# Patient Record
Sex: Female | Born: 1935
Health system: Southern US, Community
[De-identification: ages and names within clinical notes are randomized; demographics above are authoritative.]

## PROBLEM LIST (undated history)

## (undated) DIAGNOSIS — I6529 Occlusion and stenosis of unspecified carotid artery: Secondary | ICD-10-CM

## (undated) DIAGNOSIS — D492 Neoplasm of unspecified behavior of bone, soft tissue, and skin: Secondary | ICD-10-CM

## (undated) DIAGNOSIS — I10 Essential (primary) hypertension: Secondary | ICD-10-CM

## (undated) DIAGNOSIS — E7800 Pure hypercholesterolemia, unspecified: Secondary | ICD-10-CM

## (undated) DIAGNOSIS — H3530 Unspecified macular degeneration: Secondary | ICD-10-CM

## (undated) HISTORY — PX: INNER EAR SURGERY: SHX679

## (undated) HISTORY — PX: ABDOMINAL HYSTERECTOMY: SHX81

## (undated) HISTORY — PX: CHOLECYSTECTOMY: SHX55

## (undated) HISTORY — PX: COLON SURGERY: SHX602

---

## 2006-06-06 ENCOUNTER — Ambulatory Visit: Payer: Self-pay

## 2006-06-20 ENCOUNTER — Ambulatory Visit: Payer: Self-pay

## 2007-05-11 ENCOUNTER — Ambulatory Visit: Payer: Self-pay | Admitting: Family Medicine

## 2007-06-06 ENCOUNTER — Ambulatory Visit: Payer: Self-pay

## 2008-07-30 ENCOUNTER — Ambulatory Visit: Payer: Self-pay | Admitting: Gastroenterology

## 2008-08-27 ENCOUNTER — Emergency Department: Payer: Self-pay | Admitting: Internal Medicine

## 2008-08-28 ENCOUNTER — Ambulatory Visit: Payer: Self-pay | Admitting: Gastroenterology

## 2008-09-01 ENCOUNTER — Ambulatory Visit: Payer: Self-pay | Admitting: Gastroenterology

## 2008-09-15 ENCOUNTER — Ambulatory Visit: Payer: Self-pay | Admitting: Gastroenterology

## 2008-10-20 ENCOUNTER — Ambulatory Visit: Payer: Self-pay | Admitting: Gastroenterology

## 2010-03-11 ENCOUNTER — Ambulatory Visit: Payer: Self-pay | Admitting: Family Medicine

## 2010-04-12 ENCOUNTER — Ambulatory Visit: Payer: Self-pay | Admitting: Surgery

## 2010-11-05 ENCOUNTER — Ambulatory Visit: Payer: Self-pay | Admitting: Gastroenterology

## 2010-11-09 LAB — PATHOLOGY REPORT

## 2010-12-03 ENCOUNTER — Ambulatory Visit: Payer: Self-pay | Admitting: Surgery

## 2010-12-13 ENCOUNTER — Inpatient Hospital Stay: Payer: Self-pay | Admitting: Surgery

## 2010-12-15 LAB — PATHOLOGY REPORT

## 2012-04-19 ENCOUNTER — Ambulatory Visit: Payer: Self-pay | Admitting: Gastroenterology

## 2012-04-23 ENCOUNTER — Ambulatory Visit: Payer: Self-pay | Admitting: Gastroenterology

## 2012-05-08 ENCOUNTER — Ambulatory Visit: Payer: Self-pay | Admitting: Gynecologic Oncology

## 2012-05-09 LAB — CA 125: CA 125: 16 U/mL (ref 0.0–34.0)

## 2012-06-02 ENCOUNTER — Ambulatory Visit: Payer: Self-pay | Admitting: Gynecologic Oncology

## 2012-08-15 ENCOUNTER — Ambulatory Visit: Payer: Self-pay | Admitting: Gynecologic Oncology

## 2012-08-20 ENCOUNTER — Ambulatory Visit: Payer: Self-pay | Admitting: Gynecologic Oncology

## 2012-09-02 ENCOUNTER — Ambulatory Visit: Payer: Self-pay | Admitting: Gynecologic Oncology

## 2012-09-26 ENCOUNTER — Ambulatory Visit: Payer: Self-pay | Admitting: Gynecologic Oncology

## 2012-09-26 LAB — CBC
HGB: 13.4 g/dL (ref 12.0–16.0)
MCH: 31.2 pg (ref 26.0–34.0)
RBC: 4.3 10*6/uL (ref 3.80–5.20)
RDW: 13.6 % (ref 11.5–14.5)
WBC: 7.4 10*3/uL (ref 3.6–11.0)

## 2012-09-26 LAB — BASIC METABOLIC PANEL
BUN: 21 mg/dL — ABNORMAL HIGH (ref 7–18)
Chloride: 111 mmol/L — ABNORMAL HIGH (ref 98–107)
Creatinine: 1.31 mg/dL — ABNORMAL HIGH (ref 0.60–1.30)
EGFR (Non-African Amer.): 39 — ABNORMAL LOW
Potassium: 3.4 mmol/L — ABNORMAL LOW (ref 3.5–5.1)
Sodium: 144 mmol/L (ref 136–145)

## 2012-10-02 ENCOUNTER — Ambulatory Visit: Payer: Self-pay | Admitting: Gynecologic Oncology

## 2012-10-02 ENCOUNTER — Ambulatory Visit: Payer: Self-pay | Admitting: Obstetrics and Gynecology

## 2012-10-04 LAB — PATHOLOGY REPORT

## 2012-11-02 ENCOUNTER — Ambulatory Visit: Payer: Self-pay | Admitting: Gynecologic Oncology

## 2013-10-22 DIAGNOSIS — Z86010 Personal history of colonic polyps: Secondary | ICD-10-CM | POA: Insufficient documentation

## 2013-11-05 ENCOUNTER — Ambulatory Visit: Payer: Self-pay | Admitting: Gastroenterology

## 2013-11-07 LAB — PATHOLOGY REPORT

## 2014-04-07 DIAGNOSIS — H6501 Acute serous otitis media, right ear: Secondary | ICD-10-CM | POA: Diagnosis not present

## 2014-04-23 DIAGNOSIS — H3532 Exudative age-related macular degeneration: Secondary | ICD-10-CM | POA: Diagnosis not present

## 2014-04-28 DIAGNOSIS — H6500 Acute serous otitis media, unspecified ear: Secondary | ICD-10-CM | POA: Diagnosis not present

## 2014-06-04 DIAGNOSIS — H3532 Exudative age-related macular degeneration: Secondary | ICD-10-CM | POA: Diagnosis not present

## 2014-07-21 DIAGNOSIS — H3532 Exudative age-related macular degeneration: Secondary | ICD-10-CM | POA: Diagnosis not present

## 2014-07-25 NOTE — Op Note (Signed)
PATIENT NAME:  Sharon Barajas, Sharon Barajas MR#:  [id number] DATE OF BIRTH:  1931/04/30  DATE OF PROCEDURE:  10/02/2012  SURGEON:  Weber Cooks, MD  PREOPERATIVE DIAGNOSIS: Pelvic mass.    POSTOPERATIVE DIAGNOSES:  1.  Left hydrosalpinx.  2.  Adhesions, left pelvic sidewall.   PROCEDURE PERFORMED: Laparoscopy with lysis of adhesion and bilateral salpingo-oophorectomy.   COMPLICATIONS: None.   ESTIMATED BLOOD LOSS: 25 mL   INDICATION FOR SURGERY: Ms. Schlechter is a 81 year old patient who presented with slowly increasing mainly cystic mass on the left pelvic sidewall and minimal elevation of tumor markers. Therefore, the decision was made to proceed with surgery.   FINDINGS AT THE TIME OF SURGERY:  1.  Uterus and left adnexa within normal limits.  2.  Hydrosalpinx and very small and ovary on the left pelvic sidewall, encased by adhesions to the sigmoid colon.  3.  Adhesions in the previous laparotomy incision in the upper abdomen.   OPERATIVE REPORT: After adequate anesthesia had been obtained, the patient was prepped and draped in ski position. Cervix was visualized and a Hulka manipulator was inserted. Then Foley catheter was inserted.   Attention was then directed towards the abdomen. A 1 cm incision was made in the left upper abdominal quadrant. The fascia was transected and the peritoneum was identified and entered. The blunt trocar was inserted. Pneumoperitoneum was obtained and inspection was done with the above-mentioned findings. Then under direct vision, 2 5-mm trocars were placed in the left mid and lower abdomen.  Pelvic cytology was obtained. The left pelvic sidewall was entered. The cystic mass was freed by blunt and sharp dissection from the pelvic sidewall, keeping vessels and ureter under constant visualization. The mass was then freed from the sigmoid colon. The infundibulopelvic ligament was cauterized and transected. The mass was then mobilized all the way towards the uterus,  where the utero-ovarian ligament and tube were cauterized and transected. The remainder of the mass could then be freed.  It was then placed in the cul-de-sac.   Attention was directed towards the right pelvic sidewall, which was entered. Ureter and vessels were identified. The infundibulopelvic ligament was cauterized and transected. The adnexa were then mobilized towards the uterus where the utero-ovarian ligament and fallopian tube were cauterized and transected.   With visualization through a 5 mm port, the adnexa were placed in Endo Catch bag and removed through the camera port. Irrigation of the pelvis was done and adequate hemostasis was confirmed. The trocars were then removed under direct vision. There was no evidence of bleeding.   The camera port site and was closed using 2-0 Vicryl for the fascia. The subcutaneous tissues of all ports were reapproximated with 3-0 Vicryl. Dermabond was applied.   The patient tolerated the procedure well and was taken to the recovery room in satisfactory condition. Postoperative urine was clean. Pad, sponge, needle, and instrument counts were correct x 2.    ____________________________ Weber Cooks, MD bem:cc D: 10/02/2012 15:38:03 ET T: 10/02/2012 16:56:46 ET JOB#: [id number]  cc: Weber Cooks, MD, <Dictator> Weber Cooks MD ELECTRONICALLY SIGNED 10/09/2012 9:16

## 2014-09-08 DIAGNOSIS — H3532 Exudative age-related macular degeneration: Secondary | ICD-10-CM | POA: Diagnosis not present

## 2014-09-08 DIAGNOSIS — H3531 Nonexudative age-related macular degeneration: Secondary | ICD-10-CM | POA: Diagnosis not present

## 2014-09-12 DIAGNOSIS — Z8639 Personal history of other endocrine, nutritional and metabolic disease: Secondary | ICD-10-CM | POA: Diagnosis not present

## 2014-09-12 DIAGNOSIS — K219 Gastro-esophageal reflux disease without esophagitis: Secondary | ICD-10-CM | POA: Diagnosis not present

## 2014-09-12 DIAGNOSIS — R7309 Other abnormal glucose: Secondary | ICD-10-CM | POA: Diagnosis not present

## 2014-09-12 DIAGNOSIS — E782 Mixed hyperlipidemia: Secondary | ICD-10-CM | POA: Diagnosis not present

## 2014-09-12 DIAGNOSIS — I10 Essential (primary) hypertension: Secondary | ICD-10-CM | POA: Diagnosis not present

## 2014-09-16 DIAGNOSIS — H2513 Age-related nuclear cataract, bilateral: Secondary | ICD-10-CM | POA: Diagnosis not present

## 2014-09-19 DIAGNOSIS — I10 Essential (primary) hypertension: Secondary | ICD-10-CM | POA: Diagnosis not present

## 2014-09-19 DIAGNOSIS — Z8639 Personal history of other endocrine, nutritional and metabolic disease: Secondary | ICD-10-CM | POA: Diagnosis not present

## 2014-09-19 DIAGNOSIS — R0689 Other abnormalities of breathing: Secondary | ICD-10-CM | POA: Diagnosis not present

## 2014-09-19 DIAGNOSIS — E782 Mixed hyperlipidemia: Secondary | ICD-10-CM | POA: Diagnosis not present

## 2014-09-19 DIAGNOSIS — R079 Chest pain, unspecified: Secondary | ICD-10-CM | POA: Diagnosis not present

## 2014-09-19 DIAGNOSIS — R0789 Other chest pain: Secondary | ICD-10-CM | POA: Diagnosis not present

## 2014-10-17 DIAGNOSIS — R0789 Other chest pain: Secondary | ICD-10-CM | POA: Diagnosis not present

## 2014-10-17 DIAGNOSIS — E782 Mixed hyperlipidemia: Secondary | ICD-10-CM | POA: Diagnosis not present

## 2014-10-17 DIAGNOSIS — I209 Angina pectoris, unspecified: Secondary | ICD-10-CM | POA: Diagnosis not present

## 2014-10-17 DIAGNOSIS — I10 Essential (primary) hypertension: Secondary | ICD-10-CM | POA: Diagnosis not present

## 2014-10-17 DIAGNOSIS — I6523 Occlusion and stenosis of bilateral carotid arteries: Secondary | ICD-10-CM | POA: Insufficient documentation

## 2014-10-17 DIAGNOSIS — I517 Cardiomegaly: Secondary | ICD-10-CM | POA: Diagnosis not present

## 2014-10-29 DIAGNOSIS — H3532 Exudative age-related macular degeneration: Secondary | ICD-10-CM | POA: Diagnosis not present

## 2014-10-31 DIAGNOSIS — I209 Angina pectoris, unspecified: Secondary | ICD-10-CM | POA: Diagnosis not present

## 2014-10-31 DIAGNOSIS — I6523 Occlusion and stenosis of bilateral carotid arteries: Secondary | ICD-10-CM | POA: Diagnosis not present

## 2014-10-31 DIAGNOSIS — I517 Cardiomegaly: Secondary | ICD-10-CM | POA: Diagnosis not present

## 2014-11-05 DIAGNOSIS — E782 Mixed hyperlipidemia: Secondary | ICD-10-CM | POA: Diagnosis not present

## 2014-11-05 DIAGNOSIS — I6523 Occlusion and stenosis of bilateral carotid arteries: Secondary | ICD-10-CM | POA: Diagnosis not present

## 2014-11-05 DIAGNOSIS — I10 Essential (primary) hypertension: Secondary | ICD-10-CM | POA: Diagnosis not present

## 2014-11-05 DIAGNOSIS — R0789 Other chest pain: Secondary | ICD-10-CM | POA: Diagnosis not present

## 2014-11-11 DIAGNOSIS — I6529 Occlusion and stenosis of unspecified carotid artery: Secondary | ICD-10-CM | POA: Diagnosis not present

## 2014-11-11 DIAGNOSIS — E785 Hyperlipidemia, unspecified: Secondary | ICD-10-CM | POA: Diagnosis not present

## 2014-11-11 DIAGNOSIS — I10 Essential (primary) hypertension: Secondary | ICD-10-CM | POA: Diagnosis not present

## 2014-11-12 ENCOUNTER — Other Ambulatory Visit: Payer: Self-pay | Admitting: Vascular Surgery

## 2014-11-12 DIAGNOSIS — I6523 Occlusion and stenosis of bilateral carotid arteries: Secondary | ICD-10-CM

## 2014-11-19 ENCOUNTER — Ambulatory Visit
Admission: RE | Admit: 2014-11-19 | Discharge: 2014-11-19 | Disposition: A | Discharge status: Home / Self Care | Payer: Commercial Managed Care - HMO | Source: Ambulatory Visit | Attending: Vascular Surgery | Admitting: Vascular Surgery

## 2014-11-19 DIAGNOSIS — I6523 Occlusion and stenosis of bilateral carotid arteries: Secondary | ICD-10-CM

## 2014-11-19 DIAGNOSIS — I6522 Occlusion and stenosis of left carotid artery: Secondary | ICD-10-CM | POA: Insufficient documentation

## 2014-11-19 DIAGNOSIS — I6503 Occlusion and stenosis of bilateral vertebral arteries: Secondary | ICD-10-CM | POA: Diagnosis not present

## 2014-11-19 DIAGNOSIS — I6529 Occlusion and stenosis of unspecified carotid artery: Secondary | ICD-10-CM | POA: Diagnosis present

## 2014-11-19 HISTORY — DX: Essential (primary) hypertension: I10

## 2014-11-19 MED ORDER — IOHEXOL 350 MG/ML SOLN
100.0000 mL | Freq: Once | INTRAVENOUS | Status: AC | PRN
  Administered 2014-11-19: 65 mL via INTRAVENOUS

## 2014-11-21 DIAGNOSIS — E785 Hyperlipidemia, unspecified: Secondary | ICD-10-CM | POA: Diagnosis not present

## 2014-11-21 DIAGNOSIS — I10 Essential (primary) hypertension: Secondary | ICD-10-CM | POA: Diagnosis not present

## 2014-11-21 DIAGNOSIS — I6529 Occlusion and stenosis of unspecified carotid artery: Secondary | ICD-10-CM | POA: Diagnosis not present

## 2014-11-21 DIAGNOSIS — I129 Hypertensive chronic kidney disease with stage 1 through stage 4 chronic kidney disease, or unspecified chronic kidney disease: Secondary | ICD-10-CM | POA: Diagnosis not present

## 2014-12-03 DIAGNOSIS — I10 Essential (primary) hypertension: Secondary | ICD-10-CM | POA: Diagnosis not present

## 2014-12-03 DIAGNOSIS — I6523 Occlusion and stenosis of bilateral carotid arteries: Secondary | ICD-10-CM | POA: Diagnosis not present

## 2014-12-03 DIAGNOSIS — E782 Mixed hyperlipidemia: Secondary | ICD-10-CM | POA: Diagnosis not present

## 2014-12-15 ENCOUNTER — Encounter
Admission: RE | Admit: 2014-12-15 | Discharge: 2014-12-15 | Disposition: A | Discharge status: Home / Self Care | Payer: Commercial Managed Care - HMO | Source: Ambulatory Visit | Attending: Vascular Surgery | Admitting: Vascular Surgery

## 2014-12-15 DIAGNOSIS — Z90710 Acquired absence of both cervix and uterus: Secondary | ICD-10-CM | POA: Diagnosis not present

## 2014-12-15 DIAGNOSIS — I6522 Occlusion and stenosis of left carotid artery: Secondary | ICD-10-CM | POA: Diagnosis not present

## 2014-12-15 DIAGNOSIS — I2510 Atherosclerotic heart disease of native coronary artery without angina pectoris: Secondary | ICD-10-CM | POA: Diagnosis not present

## 2014-12-15 DIAGNOSIS — I252 Old myocardial infarction: Secondary | ICD-10-CM | POA: Diagnosis not present

## 2014-12-15 DIAGNOSIS — I6523 Occlusion and stenosis of bilateral carotid arteries: Secondary | ICD-10-CM | POA: Diagnosis present

## 2014-12-15 DIAGNOSIS — Z8249 Family history of ischemic heart disease and other diseases of the circulatory system: Secondary | ICD-10-CM | POA: Diagnosis not present

## 2014-12-15 DIAGNOSIS — I6529 Occlusion and stenosis of unspecified carotid artery: Secondary | ICD-10-CM | POA: Diagnosis not present

## 2014-12-15 DIAGNOSIS — E780 Pure hypercholesterolemia: Secondary | ICD-10-CM | POA: Diagnosis present

## 2014-12-15 DIAGNOSIS — H918X2 Other specified hearing loss, left ear: Secondary | ICD-10-CM | POA: Diagnosis present

## 2014-12-15 DIAGNOSIS — I10 Essential (primary) hypertension: Secondary | ICD-10-CM | POA: Diagnosis present

## 2014-12-15 HISTORY — DX: Neoplasm of unspecified behavior of bone, soft tissue, and skin: D49.2

## 2014-12-15 HISTORY — DX: Pure hypercholesterolemia, unspecified: E78.00

## 2014-12-15 HISTORY — DX: Occlusion and stenosis of unspecified carotid artery: I65.29

## 2014-12-15 LAB — CBC
HEMATOCRIT: 40.3 % (ref 35.0–47.0)
HEMOGLOBIN: 13.3 g/dL (ref 12.0–16.0)
MCH: 31.3 pg (ref 26.0–34.0)
MCHC: 33.1 g/dL (ref 32.0–36.0)
MCV: 94.5 fL (ref 80.0–100.0)
PLATELETS: 192 10*3/uL (ref 150–440)
RBC: 4.26 MIL/uL (ref 3.80–5.20)
RDW: 13.5 % (ref 11.5–14.5)
WBC: 5.8 10*3/uL (ref 3.6–11.0)

## 2014-12-15 LAB — BASIC METABOLIC PANEL
ANION GAP: 9 (ref 5–15)
BUN: 17 mg/dL (ref 6–20)
CHLORIDE: 110 mmol/L (ref 101–111)
CO2: 23 mmol/L (ref 22–32)
Calcium: 9 mg/dL (ref 8.9–10.3)
Creatinine, Ser: 0.96 mg/dL (ref 0.44–1.00)
GFR calc Af Amer: >60 mL/min (ref >60)
GFR calc non Af Amer: 54 mL/min — ABNORMAL LOW (ref >60)
GLUCOSE: 123 mg/dL — AB (ref 65–99)
POTASSIUM: 3.8 mmol/L (ref 3.5–5.1)
Sodium: 142 mmol/L (ref 135–145)

## 2014-12-15 LAB — ABO/RH: ABO/RH(D): O NEG

## 2014-12-15 LAB — SURGICAL PCR SCREEN
MRSA, PCR: NEGATIVE
STAPHYLOCOCCUS AUREUS: NEGATIVE

## 2014-12-15 LAB — TYPE AND SCREEN
ABO/RH(D): O NEG
Antibody Screen: NEGATIVE

## 2014-12-15 NOTE — Patient Instructions (Signed)
  Your procedure is scheduled on: Wednesday Sept. 14, 2016. Report to Same Day Surgery. To find out your arrival time please call [phone] between 1PM - 3PM on Tuesday Sept 13, 2016.  Remember: Instructions that are not followed completely may result in serious medical risk, up to and including death, or upon the discretion of your surgeon and anesthesiologist your surgery may need to be rescheduled.    _x___ 1. Do not eat food or drink liquids after midnight. No gum chewing or hard candies.     ____ 2. No Alcohol for 24 hours before or after surgery.   ____ 3. Bring all medications with you on the day of surgery if instructed.    _x___ 4. Notify your doctor if there is any change in your medical condition     (cold, fever, infections).     Do not wear jewelry, make-up, hairpins, clips or nail polish.  Do not wear lotions, powders, or perfumes. You may wear deodorant.  Do not shave 48 hours prior to surgery. Men may shave face and neck.  Do not bring valuables to the hospital.    [facility] is not responsible for any belongings or valuables.               Contacts, dentures or bridgework may not be worn into surgery.  Leave your suitcase in the car. After surgery it may be brought to your room.  For patients admitted to the hospital, discharge time is determined by your treatment team.   Patients discharged the day of surgery will not be allowed to drive home.    Please read over the following fact sheets that you were given:   [facility] Preparing for Surgery  _x___ Take these medicines the morning of surgery with A SIP OF WATER:    1. amLODipine (NORVASC)   2. omeprazole (PRILOSEC)   ____ Fleet Enema (as directed)   _x___ Use CHG Soap as directed  ____ Use inhalers on the day of surgery  ____ Stop metformin 2 days prior to surgery    ____ Take 1/2 of usual insulin dose the night before surgery and none on the morning of surgery.   ____ Stop  Coumadin/Plavix/aspirin on does not apply.  _x__ Stop Anti-inflammatories or Ibuprofen now. Can take Tylenol for pain.   ____ Stop supplements until after surgery.    ____ Bring C-Pap to the hospital.

## 2014-12-17 ENCOUNTER — Inpatient Hospital Stay: Payer: Commercial Managed Care - HMO | Admitting: Certified Registered"

## 2014-12-17 ENCOUNTER — Encounter
Admission: RE | Disposition: A | Discharge status: Home / Self Care | Payer: Self-pay | Source: Ambulatory Visit | Attending: Vascular Surgery

## 2014-12-17 ENCOUNTER — Encounter: Payer: Self-pay | Admitting: Anesthesiology

## 2014-12-17 ENCOUNTER — Inpatient Hospital Stay
Admission: RE | Admit: 2014-12-17 | Discharge: 2014-12-18 | DRG: 039 | Disposition: A | Discharge status: Home / Self Care | Payer: Commercial Managed Care - HMO | Source: Ambulatory Visit | Attending: Vascular Surgery | Admitting: Vascular Surgery

## 2014-12-17 DIAGNOSIS — Z90710 Acquired absence of both cervix and uterus: Secondary | ICD-10-CM

## 2014-12-17 DIAGNOSIS — Z8249 Family history of ischemic heart disease and other diseases of the circulatory system: Secondary | ICD-10-CM

## 2014-12-17 DIAGNOSIS — E780 Pure hypercholesterolemia: Secondary | ICD-10-CM | POA: Diagnosis present

## 2014-12-17 DIAGNOSIS — I10 Essential (primary) hypertension: Secondary | ICD-10-CM | POA: Diagnosis present

## 2014-12-17 DIAGNOSIS — I252 Old myocardial infarction: Secondary | ICD-10-CM | POA: Diagnosis not present

## 2014-12-17 DIAGNOSIS — H918X2 Other specified hearing loss, left ear: Secondary | ICD-10-CM | POA: Diagnosis present

## 2014-12-17 DIAGNOSIS — I6523 Occlusion and stenosis of bilateral carotid arteries: Secondary | ICD-10-CM | POA: Diagnosis present

## 2014-12-17 DIAGNOSIS — I6522 Occlusion and stenosis of left carotid artery: Secondary | ICD-10-CM | POA: Diagnosis not present

## 2014-12-17 DIAGNOSIS — I6529 Occlusion and stenosis of unspecified carotid artery: Secondary | ICD-10-CM | POA: Diagnosis present

## 2014-12-17 DIAGNOSIS — I2510 Atherosclerotic heart disease of native coronary artery without angina pectoris: Secondary | ICD-10-CM | POA: Diagnosis not present

## 2014-12-17 HISTORY — PX: ENDARTERECTOMY: SHX5162

## 2014-12-17 LAB — GLUCOSE, CAPILLARY: GLUCOSE-CAPILLARY: 134 mg/dL — AB (ref 65–99)

## 2014-12-17 SURGERY — ENDARTERECTOMY, CAROTID
Anesthesia: General | Laterality: Left

## 2014-12-17 MED ORDER — ONDANSETRON HCL 4 MG/2ML IJ SOLN: 4.0000 mg | Freq: Four times a day (QID) | INTRAMUSCULAR | Status: DC | PRN

## 2014-12-17 MED ORDER — ROCURONIUM BROMIDE 100 MG/10ML IV SOLN
INTRAVENOUS | Status: DC | PRN
  Administered 2014-12-17: 30 mg via INTRAVENOUS
  Administered 2014-12-17: 10 mg via INTRAVENOUS

## 2014-12-17 MED ORDER — DOCUSATE SODIUM 100 MG PO CAPS
100.0000 mg | ORAL_CAPSULE | Freq: Every day | ORAL | Status: DC
  Administered 2014-12-18: 100 mg via ORAL
  Filled 2014-12-17: qty 1

## 2014-12-17 MED ORDER — POTASSIUM CHLORIDE CRYS ER 20 MEQ PO TBCR: 20.0000 meq | EXTENDED_RELEASE_TABLET | Freq: Every day | ORAL | Status: DC | PRN

## 2014-12-17 MED ORDER — ASPIRIN EC 81 MG PO TBEC: 81.0000 mg | DELAYED_RELEASE_TABLET | Freq: Every day | ORAL | Status: DC

## 2014-12-17 MED ORDER — DOPAMINE-DEXTROSE 3.2-5 MG/ML-% IV SOLN: 3.0000 ug/kg/min | INTRAVENOUS | Status: DC

## 2014-12-17 MED ORDER — CLOPIDOGREL BISULFATE 75 MG PO TABS: 75.0000 mg | ORAL_TABLET | Freq: Every day | ORAL | Status: DC

## 2014-12-17 MED ORDER — SODIUM CHLORIDE 0.9 % IV SOLN
INTRAVENOUS | Status: DC
  Administered 2014-12-17 (×2): via INTRAVENOUS

## 2014-12-17 MED ORDER — CEFAZOLIN SODIUM 1-5 GM-% IV SOLN
INTRAVENOUS | Status: AC
  Filled 2014-12-17: qty 50

## 2014-12-17 MED ORDER — LIDOCAINE HCL (PF) 1 % IJ SOLN
INTRAMUSCULAR | Status: AC
Start: 2014-12-17 — End: 2014-12-17
  Filled 2014-12-17: qty 30

## 2014-12-17 MED ORDER — OCUVITE-LUTEIN PO CAPS
1.0000 | ORAL_CAPSULE | Freq: Two times a day (BID) | ORAL | Status: DC
  Administered 2014-12-17 – 2014-12-18 (×3): 1 via ORAL
  Filled 2014-12-17 (×3): qty 1

## 2014-12-17 MED ORDER — CEFAZOLIN SODIUM 1-5 GM-% IV SOLN
1.0000 g | Freq: Once | INTRAVENOUS | Status: AC
  Administered 2014-12-17: 1 g via INTRAVENOUS

## 2014-12-17 MED ORDER — ACETAMINOPHEN 325 MG PO TABS: 325.0000 mg | ORAL_TABLET | ORAL | Status: DC | PRN

## 2014-12-17 MED ORDER — PHENYLEPHRINE HCL 10 MG/ML IJ SOLN
10000.0000 ug | INTRAMUSCULAR | Status: DC | PRN
  Administered 2014-12-17: 60 ug via INTRAVENOUS

## 2014-12-17 MED ORDER — LIDOCAINE HCL 1 % IJ SOLN
INTRAMUSCULAR | Status: DC | PRN
  Administered 2014-12-17: 10 mL

## 2014-12-17 MED ORDER — OXYCODONE-ACETAMINOPHEN 5-325 MG PO TABS: 1.0000 | ORAL_TABLET | Freq: Four times a day (QID) | ORAL | Status: DC | PRN

## 2014-12-17 MED ORDER — LISINOPRIL 20 MG PO TABS
20.0000 mg | ORAL_TABLET | Freq: Every day | ORAL | Status: DC
  Administered 2014-12-17: 20 mg via ORAL
  Filled 2014-12-17: qty 1

## 2014-12-17 MED ORDER — SODIUM CHLORIDE 0.9 % IV SOLN: 500.0000 mL | Freq: Once | INTRAVENOUS | Status: DC | PRN

## 2014-12-17 MED ORDER — ESMOLOL HCL-SODIUM CHLORIDE 2000 MG/100ML IV SOLN
25.0000 ug/kg/min | INTRAVENOUS | Status: DC
  Filled 2014-12-17: qty 100

## 2014-12-17 MED ORDER — CLOPIDOGREL BISULFATE 75 MG PO TABS
75.0000 mg | ORAL_TABLET | Freq: Every day | ORAL | Status: DC
  Administered 2014-12-18: 75 mg via ORAL
  Filled 2014-12-17: qty 1

## 2014-12-17 MED ORDER — ACETAMINOPHEN 650 MG RE SUPP: 325.0000 mg | RECTAL | Status: DC | PRN

## 2014-12-17 MED ORDER — MAGNESIUM SULFATE 2 GM/50ML IV SOLN: 2.0000 g | Freq: Every day | INTRAVENOUS | Status: DC | PRN

## 2014-12-17 MED ORDER — HEPARIN SODIUM (PORCINE) 1000 UNIT/ML IJ SOLN
INTRAMUSCULAR | Status: AC
  Filled 2014-12-17: qty 1

## 2014-12-17 MED ORDER — DEXTROSE 5 % IV SOLN
1.5000 g | Freq: Two times a day (BID) | INTRAVENOUS | Status: AC
  Administered 2014-12-17 (×2): 1.5 g via INTRAVENOUS
  Filled 2014-12-17 (×2): qty 1.5

## 2014-12-17 MED ORDER — POTASSIUM CHLORIDE ER 10 MEQ PO TBCR
10.0000 meq | EXTENDED_RELEASE_TABLET | ORAL | Status: DC
  Administered 2014-12-17 – 2014-12-18 (×2): 10 meq via ORAL
  Filled 2014-12-17 (×5): qty 1

## 2014-12-17 MED ORDER — ONDANSETRON HCL 4 MG/2ML IJ SOLN: 4.0000 mg | Freq: Once | INTRAMUSCULAR | Status: DC | PRN

## 2014-12-17 MED ORDER — SODIUM CHLORIDE 0.9 % IV SOLN
INTRAVENOUS | Status: DC
  Administered 2014-12-17: 07:00:00 via INTRAVENOUS

## 2014-12-17 MED ORDER — PHENOL 1.4 % MT LIQD
1.0000 | OROMUCOSAL | Status: DC | PRN
  Filled 2014-12-17: qty 177

## 2014-12-17 MED ORDER — CEFAZOLIN SODIUM 1 G IJ SOLR
INTRAMUSCULAR | Status: AC
  Filled 2014-12-17: qty 10

## 2014-12-17 MED ORDER — PROPOFOL 10 MG/ML IV BOLUS
INTRAVENOUS | Status: DC | PRN
  Administered 2014-12-17: 100 mg via INTRAVENOUS

## 2014-12-17 MED ORDER — SUGAMMADEX SODIUM 200 MG/2ML IV SOLN
INTRAVENOUS | Status: DC | PRN
  Administered 2014-12-17: 100 mg via INTRAVENOUS

## 2014-12-17 MED ORDER — LIDOCAINE HCL (CARDIAC) 20 MG/ML IV SOLN
INTRAVENOUS | Status: DC | PRN
  Administered 2014-12-17: 60 mg via INTRAVENOUS

## 2014-12-17 MED ORDER — AMLODIPINE BESYLATE 10 MG PO TABS
10.0000 mg | ORAL_TABLET | ORAL | Status: DC
  Administered 2014-12-18: 10 mg via ORAL
  Filled 2014-12-17: qty 1

## 2014-12-17 MED ORDER — GUAIFENESIN-DM 100-10 MG/5ML PO SYRP: 15.0000 mL | ORAL_SOLUTION | ORAL | Status: DC | PRN

## 2014-12-17 MED ORDER — FENTANYL CITRATE (PF) 100 MCG/2ML IJ SOLN
INTRAMUSCULAR | Status: DC | PRN
  Administered 2014-12-17: 50 ug via INTRAVENOUS
  Administered 2014-12-17: 100 ug via INTRAVENOUS

## 2014-12-17 MED ORDER — VITAMIN D (ERGOCALCIFEROL) 1.25 MG (50000 UNIT) PO CAPS: 50000.0000 [IU] | ORAL_CAPSULE | ORAL | Status: DC

## 2014-12-17 MED ORDER — NITROGLYCERIN IN D5W 200-5 MCG/ML-% IV SOLN
INTRAVENOUS | Status: AC
  Filled 2014-12-17: qty 250

## 2014-12-17 MED ORDER — PHENYLEPHRINE HCL 10 MG/ML IJ SOLN
INTRAMUSCULAR | Status: DC | PRN
  Administered 2014-12-17 (×4): 100 ug via INTRAVENOUS

## 2014-12-17 MED ORDER — HEPARIN SODIUM (PORCINE) 1000 UNIT/ML IJ SOLN
INTRAMUSCULAR | Status: DC | PRN
  Administered 2014-12-17: 5 mL via INTRAVENOUS

## 2014-12-17 MED ORDER — LACTATED RINGERS IV SOLN: INTRAVENOUS | Status: DC

## 2014-12-17 MED ORDER — ALUM & MAG HYDROXIDE-SIMETH 200-200-20 MG/5ML PO SUSP: 15.0000 mL | ORAL | Status: DC | PRN

## 2014-12-17 MED ORDER — SODIUM CHLORIDE 0.9 % IV SOLN
INTRAVENOUS | Status: DC | PRN
  Administered 2014-12-17: 50 mL via INTRAMUSCULAR

## 2014-12-17 MED ORDER — ASPIRIN EC 81 MG PO TBEC
81.0000 mg | DELAYED_RELEASE_TABLET | ORAL | Status: DC
  Administered 2014-12-17 – 2014-12-18 (×2): 81 mg via ORAL
  Filled 2014-12-17 (×2): qty 1

## 2014-12-17 MED ORDER — OXYCODONE-ACETAMINOPHEN 5-325 MG PO TABS
1.0000 | ORAL_TABLET | ORAL | Status: DC | PRN
  Filled 2014-12-17: qty 2

## 2014-12-17 MED ORDER — SODIUM CHLORIDE 0.9 % IV SOLN
INTRAVENOUS | Status: DC
Start: 2014-12-17 — End: 2014-12-18
  Administered 2014-12-17: 12:00:00 via INTRAVENOUS

## 2014-12-17 MED ORDER — NITROGLYCERIN 0.4 MG SL SUBL: 0.4000 mg | SUBLINGUAL_TABLET | SUBLINGUAL | Status: DC | PRN

## 2014-12-17 MED ORDER — FENTANYL CITRATE (PF) 100 MCG/2ML IJ SOLN: 25.0000 ug | INTRAMUSCULAR | Status: DC | PRN

## 2014-12-17 MED ORDER — HYDRALAZINE HCL 20 MG/ML IJ SOLN: 5.0000 mg | INTRAMUSCULAR | Status: DC | PRN

## 2014-12-17 MED ORDER — LABETALOL HCL 5 MG/ML IV SOLN: 10.0000 mg | INTRAVENOUS | Status: DC | PRN

## 2014-12-17 MED ORDER — FAMOTIDINE IN NACL 20-0.9 MG/50ML-% IV SOLN
20.0000 mg | Freq: Two times a day (BID) | INTRAVENOUS | Status: DC
  Administered 2014-12-17 (×2): 20 mg via INTRAVENOUS
  Filled 2014-12-17 (×5): qty 50

## 2014-12-17 MED ORDER — METOPROLOL TARTRATE 1 MG/ML IV SOLN: 2.0000 mg | INTRAVENOUS | Status: DC | PRN

## 2014-12-17 MED ORDER — NITROGLYCERIN IN D5W 200-5 MCG/ML-% IV SOLN: 5.0000 ug/min | INTRAVENOUS | Status: DC

## 2014-12-17 MED ORDER — DEXAMETHASONE SODIUM PHOSPHATE 4 MG/ML IJ SOLN
INTRAMUSCULAR | Status: DC | PRN
  Administered 2014-12-17: 5 mg via INTRAVENOUS

## 2014-12-17 MED ORDER — ONDANSETRON HCL 4 MG/2ML IJ SOLN
INTRAMUSCULAR | Status: DC | PRN
Start: 2014-12-17 — End: 2014-12-17
  Administered 2014-12-17: 4 mg via INTRAVENOUS

## 2014-12-17 MED ORDER — SIMVASTATIN 20 MG PO TABS
20.0000 mg | ORAL_TABLET | Freq: Every day | ORAL | Status: DC
Start: 2014-12-17 — End: 2014-12-18
  Administered 2014-12-17: 20 mg via ORAL
  Filled 2014-12-17: qty 1

## 2014-12-17 MED ORDER — PANTOPRAZOLE SODIUM 40 MG PO TBEC
40.0000 mg | DELAYED_RELEASE_TABLET | Freq: Every day | ORAL | Status: DC
  Administered 2014-12-17 – 2014-12-18 (×2): 40 mg via ORAL
  Filled 2014-12-17 (×2): qty 1

## 2014-12-17 MED ORDER — MORPHINE SULFATE (PF) 2 MG/ML IV SOLN
2.0000 mg | INTRAVENOUS | Status: DC | PRN
  Administered 2014-12-17 (×2): 2 mg via INTRAVENOUS
  Filled 2014-12-17: qty 1
  Filled 2014-12-17: qty 2

## 2014-12-17 MED ORDER — SODIUM CHLORIDE 0.9 % IR SOLN
Status: DC | PRN
  Administered 2014-12-17: 150 mL

## 2014-12-17 SURGICAL SUPPLY — 58 items
BAG DECANTER FOR FLEXI CONT (MISCELLANEOUS) ×3 IMPLANT
BLADE SURG 15 STRL LF DISP TIS (BLADE) ×1 IMPLANT
BLADE SURG 15 STRL SS (BLADE) ×2
BLADE SURG SZ11 CARB STEEL (BLADE) ×3 IMPLANT
BOOT SUTURE AID YELLOW STND (SUTURE) ×3 IMPLANT
BRUSH SCRUB 4% CHG (MISCELLANEOUS) ×3 IMPLANT
CANISTER SUCT 1200ML W/VALVE (MISCELLANEOUS) ×3 IMPLANT
CATH TRAY 16F METER LATEX (MISCELLANEOUS) ×3 IMPLANT
DRAPE INCISE IOBAN 66X45 STRL (DRAPES) ×3 IMPLANT
DRAPE PED LAPAROTOMY (DRAPES) ×3 IMPLANT
DRAPE SHEET LG 3/4 BI-LAMINATE (DRAPES) ×3 IMPLANT
DRSG TEGADERM 4X4.75 (GAUZE/BANDAGES/DRESSINGS) IMPLANT
DRSG TELFA 3X8 NADH (GAUZE/BANDAGES/DRESSINGS) IMPLANT
DURAPREP 26ML APPLICATOR (WOUND CARE) ×3 IMPLANT
ELECT CAUTERY BLADE 6.4 (BLADE) ×3 IMPLANT
EVICEL 2ML SEALANT HUMAN (Miscellaneous) ×3 IMPLANT
GLOVE BIO SURGEON STRL SZ7 (GLOVE) ×6 IMPLANT
GOWN STRL REUS W/ TWL LRG LVL3 (GOWN DISPOSABLE) ×1 IMPLANT
GOWN STRL REUS W/ TWL XL LVL3 (GOWN DISPOSABLE) ×2 IMPLANT
GOWN STRL REUS W/TWL LRG LVL3 (GOWN DISPOSABLE) ×2
GOWN STRL REUS W/TWL XL LVL3 (GOWN DISPOSABLE) ×4
HEMOSTAT SURGICEL 2X3 (HEMOSTASIS) ×3 IMPLANT
IV NS 250ML (IV SOLUTION) ×2
IV NS 250ML BAXH (IV SOLUTION) ×1 IMPLANT
KIT RM TURNOVER STRD PROC AR (KITS) ×3 IMPLANT
LABEL OR SOLS (LABEL) ×3 IMPLANT
LIQUID BAND (GAUZE/BANDAGES/DRESSINGS) ×3 IMPLANT
LOOP RED MAXI  1X406MM (MISCELLANEOUS) ×4
LOOP VESSEL MAXI 1X406 RED (MISCELLANEOUS) ×2 IMPLANT
LOOP VESSEL MINI 0.8X406 BLUE (MISCELLANEOUS) ×1 IMPLANT
LOOPS BLUE MINI 0.8X406MM (MISCELLANEOUS) ×2
NEEDLE FILTER BLUNT 18X 1/2SAF (NEEDLE) ×2
NEEDLE FILTER BLUNT 18X1 1/2 (NEEDLE) ×1 IMPLANT
NEEDLE HYPO 25X1 1.5 SAFETY (NEEDLE) ×3 IMPLANT
NS IRRIG 1000ML POUR BTL (IV SOLUTION) ×3 IMPLANT
PACK BASIN MAJOR ARMC (MISCELLANEOUS) ×3 IMPLANT
PAD GROUND ADULT SPLIT (MISCELLANEOUS) ×3 IMPLANT
PATCH CAROTID ECM VASC 1X10 (Prosthesis & Implant Heart) ×3 IMPLANT
PENCIL ELECTRO HAND CTR (MISCELLANEOUS) IMPLANT
SHUNT CAROTID PRUITT F3 T3103A (SHUNT) ×3 IMPLANT
SUT MNCRL 4-0 (SUTURE) ×2
SUT MNCRL 4-0 27XMFL (SUTURE) ×1
SUT PROLENE 6 0 BV (SUTURE) ×15 IMPLANT
SUT PROLENE 7 0 BV 1 (SUTURE) ×9 IMPLANT
SUT SILK 2 0 (SUTURE) ×2
SUT SILK 2-0 18XBRD TIE 12 (SUTURE) ×1 IMPLANT
SUT SILK 3 0 (SUTURE) ×2
SUT SILK 3-0 18XBRD TIE 12 (SUTURE) ×1 IMPLANT
SUT SILK 4 0 (SUTURE) ×2
SUT SILK 4-0 18XBRD TIE 12 (SUTURE) ×1 IMPLANT
SUT VIC AB 3-0 SH 27 (SUTURE) ×4
SUT VIC AB 3-0 SH 27X BRD (SUTURE) ×2 IMPLANT
SUTURE MNCRL 4-0 27XMF (SUTURE) ×1 IMPLANT
SYR 20CC LL (SYRINGE) ×3 IMPLANT
SYRINGE 10CC LL (SYRINGE) ×6 IMPLANT
TOWEL OR 17X26 4PK STRL BLUE (TOWEL DISPOSABLE) ×3 IMPLANT
TUBING CONNECTING 10 (TUBING) IMPLANT
TUBING CONNECTING 10' (TUBING)

## 2014-12-17 NOTE — Transfer of Care (Signed)
Immediate Anesthesia Transfer of Care Note  Patient: [name]  Procedure(s) Performed: Procedure(s): ENDARTERECTOMY CAROTID (Left)  Patient Location: PACU  Anesthesia Type:General  Level of Consciousness: awake, alert , oriented and patient cooperative  Airway & Oxygen Therapy: Patient Spontanous Breathing and Patient connected to face mask oxygen  Post-op Assessment: Report given to RN, Post -op Vital signs reviewed and stable and Patient moving all extremities X 4  Post vital signs: Reviewed and stable  Last Vitals:  Filed Vitals:   12/17/14 1016  BP: 111/53  Pulse: 86  Temp: 36.8 C  Resp: 14    Complications: No apparent anesthesia complications

## 2014-12-17 NOTE — Op Note (Signed)
[facility]   OPERATIVE NOTE  PROCEDURE:   1.  Left carotid endarterectomy with CorMatrix arterial patch reconstruction  PRE-OPERATIVE DIAGNOSIS: 1.  High-grade left carotid stenosis 2. Mild to moderate right carotid artery stenosis  POST-OPERATIVE DIAGNOSIS: same as above   SURGEON: Leotis Pain, MD  ASSISTANT(S): None  ANESTHESIA: general  ESTIMATED BLOOD LOSS: 50 cc  FINDING(S): 1.  Left carotid plaque.  SPECIMEN(S):  Carotid plaque (sent to Pathology)  INDICATIONS:   Sharon Barajas is a 81 y.o. female who presents with left carotid stenosis of 80-85 %.  I discussed with the patient the risks, benefits, and alternatives to carotid endarterectomy.  I discussed the differences between carotid stenting and carotid endarterectomy. I discussed the procedural details of carotid endarterectomy with the patient.  The patient is aware that the risks of carotid endarterectomy include but are not limited to: bleeding, infection, stroke, myocardial infarction, death, cranial nerve injuries both temporary and permanent, neck hematoma, possible airway compromise, labile blood pressure post-operatively, cerebral hyperperfusion syndrome, and possible need for additional interventions in the future. The patient is aware of the risks and agrees to proceed forward with the procedure.  DESCRIPTION: After full informed written consent was obtained from the patient, the patient was brought back to the operating room and placed supine upon the operating table.  Prior to induction, the patient received IV antibiotics.  After obtaining adequate anesthesia, the patient was placed into a modified beach chair position with a shoulder roll in place and the patient's neck slightly hyperextended and rotated away from the surgical site.  The patient was prepped in the standard fashion for a carotid endarterectomy.  I made an incision anterior to the sternocleidomastoid muscle and dissected  down through the subcutaneous tissue.  The platysmas was opened with electrocautery.  Then I dissected down to the internal jugular vein and facial vein.  The facial vein is ligated and divided between 2-0 silk ties.  This was dissected posteriorly until I obtained visualization of the common carotid artery.  This was dissected out and then a vessel loop was placed around the common carotid artery.  I then dissected in a periadventitial fashion along the common carotid artery up to the bifurcation.  I then identified the external carotid artery and the superior thyroid artery.  I placed a vessel loop around the superior thyroid artery, and I also dissected out the external carotid artery and placed a vessel loop around it. The superior thyroid artery was quite small and I elected to ligate the artery using a 6-0 Prolene and a 3-0 silk on the arterial side and a 3-0 silk on the distal side. In the process of this dissection, the hypoglossal nerve was identified and protected from harm.  I then dissected out the internal carotid artery until I identified an area in the internal carotid artery clearly above the stenosis.  I dissected slightly distal to this area, and placed a vessel loop around the artery.  At this point, we gave the patient 5000 units of intravenous heparin.  After this was allowed to circulate for several minutes, I pulled up control on the vessel loops to clamp the internal carotid artery, external carotid artery, and then the common carotid artery.  I then made an arteriotomy in the common carotid artery with a 11 blade, and extended the arteriotomy with a Potts scissor down into the common carotid artery, then I carried the arteriotomy through the bifurcation into the internal carotid artery  until I reached an area that was not diseased.  At this point, I took the [place] shunt that previously been prepared and I inserted it into the internal carotid artery first, and then into the common  carotid artery taking care to flush and de-air prior to release of control. At this point, I started the endarterectomy in the common carotid artery with a [place] elevator and carried this dissection down into the common carotid artery circumferentially.  Then I transected the plaque at a segment where it was adherent and transected the plaque with Potts scissors.  I then carried this dissection up into the external carotid artery.  The plaque was extracted by unclamping the external carotid artery and performing an eversion endarterectomy.  The dissection was then carried into the internal carotid artery where a nice feathered end point was created with gentle traction.  I passed the plaque off the field as a specimen. At this point I removed all loose flecks and remaining disease possible.  At this point, I was satisfied that the minimal remaining disease was densely adherent to the wall and wall integrity was intact. The distal endpoint was tacked down with three 7-0 Prolene sutures.  I then fashioned a CorMatrix arterial patch for the artery and sewed it in place with two running stitch of 6-0 Prolene.  I started at the distal endpoint and ran one half the length of the arteriotomy.  I then cut and beveled the patch to an appropriate length to match the arteriotomy.  I started the second 6-0 Prolene at the proximal end point.  The medial suture line was completed and the lateral suture line was run approximately one quarter the length of the arteriotomy.  Prior to completing this patch angioplasty, I removed the shunt first from the internal carotid artery, from which there was excellent backbleeding, and clamped it.  Then I removed the shunt from the common carotid artery, from which there was excellent antegrade bleeding, and then clamped it.  At this point, I allowed the external carotid artery to backbleed, which was excellent.  Then I instilled heparinized saline in this patched artery and then completed  the patch angioplasty in the usual fashion.  First, I released the clamp on the external carotid artery, then I released it on the common carotid artery.  After waiting a few seconds, I then released it on the internal carotid artery. Several minutes of pressure were held and four 6-0 Prolene patch sutures were used as needed for hemostasis.  At this point, I placed Surgicel and Evicel topical hemostatic agents.  There was no more active bleeding in the surgical site.  The sternocleidomastoid space was closed with three interrupted 3-0 Vicryl sutures. I then reapproximated the platysma muscle with a running stitch of 3-0 Vicryl.  The skin was then closed with a running subcuticular 4-0 Monocryl.  The skin was then cleaned, dried and Dermabond was used to reinforce the skin closure.  The patient awakened and was taken to the recovery room in stable condition, following commands and moving all four extremities without any apparent deficits.    COMPLICATIONS: none  CONDITION: stable  Sharon Barajas  12/17/2014, 10:09 AM

## 2014-12-17 NOTE — Discharge Instructions (Signed)
You may shower as of Friday. Keep incision clean and dry, Dermabond to fall off on its own. No driving while on pain medication.

## 2014-12-17 NOTE — Anesthesia Procedure Notes (Signed)
Procedure Name: Intubation Date/Time: 12/17/2014 7:57 AM Performed by: Silvana Newness Pre-anesthesia Checklist: Patient identified, Emergency Drugs available, Suction available, Patient being monitored and Timeout performed Patient Re-evaluated:Patient Re-evaluated prior to inductionOxygen Delivery Method: Circle system utilized Preoxygenation: Pre-oxygenation with 100% oxygen Intubation Type: IV induction Ventilation: Mask ventilation without difficulty Laryngoscope Size: Mac and 3 Grade View: Grade I Tube type: Oral Tube size: 7.0 mm Number of attempts: 1 Airway Equipment and Method: Rigid stylet Placement Confirmation: ETT inserted through vocal cords under direct vision,  positive ETCO2 and breath sounds checked- equal and bilateral Secured at: 18 cm Tube secured with: Tape Dental Injury: Teeth and Oropharynx as per pre-operative assessment

## 2014-12-17 NOTE — Progress Notes (Signed)
Patient resting quietly post op. Some neck pain which is appropriate and typical No neck swelling or signs of bleeding Neuro exam appears intact with no obvious deficits HR around 80 BP around 112/48 Clear liquids today, can have regular diet tomorrow Watch in ccu overnight Check labs in the am

## 2014-12-17 NOTE — Anesthesia Preprocedure Evaluation (Signed)
Anesthesia Evaluation  Patient identified by MRN, date of birth, ID band Patient awake    Reviewed: Allergy & Precautions, H&P , NPO status , Patient's Chart, lab work & pertinent test results, reviewed documented beta blocker date and time   History of Anesthesia Complications Negative for: history of anesthetic complications  Airway Mallampati: III  TM Distance: >3 FB Neck ROM: full    Dental no notable dental hx. (+) Poor Dentition, Missing   Pulmonary neg pulmonary ROS,    Pulmonary exam normal breath sounds clear to auscultation       Cardiovascular Exercise Tolerance: Good hypertension, On Medications (-) angina+ Peripheral Vascular Disease (Carotid artery disease)  (-) CAD, (-) Past MI, (-) Cardiac Stents and (-) CABG Normal cardiovascular exam(-) dysrhythmias  Rhythm:regular Rate:Normal     Neuro/Psych negative neurological ROS  negative psych ROS   GI/Hepatic Neg liver ROS, GERD  Medicated and Controlled,  Endo/Other  negative endocrine ROS  Renal/GU negative Renal ROS  negative genitourinary   Musculoskeletal   Abdominal   Peds  Hematology negative hematology ROS (+)   Anesthesia Other Findings Past Medical History:   Hypertension                                                 Hypercholesteremia                                           Carotid artery stenosis                                        Comment:bilateral   Ear tumors                                                     Comment:tumor in left ear removed. total loss of               hearing in left ear.   Reproductive/Obstetrics negative OB ROS                             Anesthesia Physical Anesthesia Plan  ASA: III  Anesthesia Plan: General   Post-op Pain Management:    Induction:   Airway Management Planned:   Additional Equipment:   Intra-op Plan:   Post-operative Plan:   Informed Consent: I  have reviewed the patients History and Physical, chart, labs and discussed the procedure including the risks, benefits and alternatives for the proposed anesthesia with the patient or authorized representative who has indicated his/her understanding and acceptance.   Dental Advisory Given  Plan Discussed with: Anesthesiologist, CRNA and Surgeon  Anesthesia Plan Comments:         Anesthesia Quick Evaluation

## 2014-12-17 NOTE — Discharge Summary (Signed)
[facility]    Discharge Summary    Patient ID:  Sharon Barajas MRN: [id number] DOB/AGE: Jan 09, 1932 81 y.o.  Admit date: 12/17/2014 Discharge date: 12/18/2014 Date of Surgery: 12/17/2014 Surgeon: Surgeon(s): Algernon Huxley, MD  Admission Diagnosis: carotid artery stenosis  Discharge Diagnoses:  carotid artery stenosis  Secondary Diagnoses: Past Medical History  Diagnosis Date  . Hypertension   . Hypercholesteremia   . Carotid artery stenosis     bilateral  . Ear tumors     tumor in left ear removed. total loss of hearing in left ear.    Procedure(s): ENDARTERECTOMY CAROTID (LEFT)  Discharged Condition: Good  HPI:  The patient is a 81 year old female s/p left carotid endarterectomy for high grade left carotid stenosis.  Hospital Course:  [name] is a 81 y.o. female is S/P:  Procedure(s): ENDARTERECTOMY CAROTID (LEFT) Extubated: POD # 0  The patients inpatient stay was uneventful. She tolerated the procedure well and was transferred from the PACU to ICU for close observation. Her night of surgery was uneventful. POD #1 the patients foley was removed, her diet was advanced and her pain was controlled with PO medication. She was ambulating independently.   Physical exam:  A&Ox3, NAD Face: No facial droop, tongue midline Neck: Trachea midline, incision clean dry and intact with dermabond CV: RRR Pulmonary: CTA Bilaterally Abdomen: Soft, Nontender, Nondistended Extremity: Neuro, vascular, muscular intact - equal strength upper, lower, right and left  Labs as below Complications: None  Consults:   None  Significant Diagnostic Studies: CBC Lab Results  Component Value Date   WBC 7.5 12/18/2014   HGB 11.5* 12/18/2014   HCT 34.8* 12/18/2014   MCV 93.7 12/18/2014   PLT 153 12/18/2014    BMET    Component Value Date/Time   NA 143 12/18/2014 0436   NA 144 09/26/2012 1340   K 4.2 12/18/2014 0436   K 3.4* 09/26/2012  1340   CL 113* 12/18/2014 0436   CL 111* 09/26/2012 1340   CO2 22 12/18/2014 0436   CO2 26 09/26/2012 1340   GLUCOSE 104* 12/18/2014 0436   GLUCOSE 116* 09/26/2012 1340   BUN 15 12/18/2014 0436   BUN 21* 09/26/2012 1340   CREATININE 0.86 12/18/2014 0436   CREATININE 1.31* 09/26/2012 1340   CALCIUM 8.2* 12/18/2014 0436   CALCIUM 9.1 09/26/2012 1340   GFRNONAA >60 12/18/2014 0436   GFRNONAA 39* 09/26/2012 1340   GFRAA >60 12/18/2014 0436   GFRAA 45* 09/26/2012 1340   COAG No results found for: INR, PROTIME   Disposition:  Discharge to: Home    Medication List    TAKE these medications        amLODipine 10 MG tablet  Commonly known as:  NORVASC  Take 10 mg by mouth every morning.     aspirin 81 MG tablet  Take 81 mg by mouth every morning.     clopidogrel 75 MG tablet  Commonly known as:  PLAVIX  Take 1 tablet (75 mg total) by mouth daily.     ergocalciferol 50000 UNITS capsule  Commonly known as:  VITAMIN D2  Take 50,000 Units by mouth once a week.     ICAPS Caps  Take 1 capsule by mouth 2 (two) times daily.     lisinopril 20 MG tablet  Commonly known as:  PRINIVIL,ZESTRIL  Take 20 mg by mouth at bedtime.     nitroGLYCERIN 0.4 MG SL tablet  Commonly known as:  NITROSTAT  Place 0.4 mg under the tongue every 5 (five) minutes as needed for chest pain.     omeprazole 20 MG capsule  Commonly known as:  PRILOSEC  Take 20 mg by mouth 2 (two) times daily before a meal.     oxyCODONE-acetaminophen 5-325 MG per tablet  Commonly known as:  PERCOCET/ROXICET  Take 1 tablet by mouth every 6 (six) hours as needed (Pain).     potassium chloride 10 MEQ tablet  Commonly known as:  K-DUR  Take 10 mEq by mouth every morning.     simvastatin 20 MG tablet  Commonly known as:  ZOCOR  Take 20 mg by mouth daily at 6 PM.       Verbal and written Discharge instructions given to the patient. Wound care per Discharge AVS Follow-up Information    Follow up with DEW,JASON,  MD On 01/09/2015.   Specialties:  Vascular Surgery, Radiology, Interventional Cardiology   Why:  at 2:15pm   Contact information:   [place] [address] [phone]       Signed: Sela Hua, PA-C  12/18/2014, 4:43 PM

## 2014-12-17 NOTE — H&P (Signed)
  [facility] History & Physical Update  The patient was interviewed and re-examined.  The patient's previous History and Physical has been reviewed and is unchanged.  There is no change in the plan of care. We plan to proceed with the scheduled procedure.  DEW,JASON, MD  12/17/2014, 7:28 AM

## 2014-12-18 LAB — CBC
HCT: 34.8 % — ABNORMAL LOW (ref 35.0–47.0)
Hemoglobin: 11.5 g/dL — ABNORMAL LOW (ref 12.0–16.0)
MCH: 31 pg (ref 26.0–34.0)
MCHC: 33.1 g/dL (ref 32.0–36.0)
MCV: 93.7 fL (ref 80.0–100.0)
PLATELETS: 153 10*3/uL (ref 150–440)
RBC: 3.72 MIL/uL — AB (ref 3.80–5.20)
RDW: 13.5 % (ref 11.5–14.5)
WBC: 7.5 10*3/uL (ref 3.6–11.0)

## 2014-12-18 LAB — BASIC METABOLIC PANEL
ANION GAP: 8 (ref 5–15)
BUN: 15 mg/dL (ref 6–20)
CO2: 22 mmol/L (ref 22–32)
Calcium: 8.2 mg/dL — ABNORMAL LOW (ref 8.9–10.3)
Chloride: 113 mmol/L — ABNORMAL HIGH (ref 101–111)
Creatinine, Ser: 0.86 mg/dL (ref 0.44–1.00)
GFR calc Af Amer: >60 mL/min (ref >60)
GLUCOSE: 104 mg/dL — AB (ref 65–99)
POTASSIUM: 4.2 mmol/L (ref 3.5–5.1)
SODIUM: 143 mmol/L (ref 135–145)

## 2014-12-18 LAB — SURGICAL PATHOLOGY

## 2014-12-18 MED ORDER — OXYCODONE-ACETAMINOPHEN 5-325 MG PO TABS: 1.0000 | ORAL_TABLET | Freq: Four times a day (QID) | ORAL | Status: DC | PRN

## 2014-12-18 MED ORDER — CLOPIDOGREL BISULFATE 75 MG PO TABS: 75.0000 mg | ORAL_TABLET | Freq: Every day | ORAL | Status: DC

## 2014-12-18 NOTE — Progress Notes (Signed)
Patient ambulated around nursing station x3 without any difficulty.  Tolerated diet well, per Dr Lucky Cowboy who was at bedside, patient can go home without eating a regular diet.   Patient updated.  Discharge instructions and prescription given to patient and sister without further questions.  IVs removed, catheter intact. Patient belonging returned.

## 2014-12-18 NOTE — Anesthesia Postprocedure Evaluation (Signed)
  Anesthesia Post-op Note  Patient: [place]  Procedure(s) Performed: Procedure(s): ENDARTERECTOMY CAROTID (Left)  Anesthesia type:General  Patient location: CCU8  Post pain: Pain level controlled  Post assessment: Post-op Vital signs reviewed, Patient's Cardiovascular Status Stable, Respiratory Function Stable, Patent Airway and No signs of Nausea or vomiting  Post vital signs: Reviewed and stable  Last Vitals:  Filed Vitals:   12/18/14 0700  BP: 128/55  Pulse:   Temp:   Resp: 19    Level of consciousness: awake, alert  and patient cooperative  Complications: No apparent anesthesia complications

## 2014-12-18 NOTE — Progress Notes (Signed)
[facility]  Daily Progress Note  Subjective: 1 Day Post-Op: Left Carotid Endarterectomy for high grade carotid stenosis.  Patient without complaint this AM - states she is a "little sore". No issues over night. Foley removed this AM.   Objective: Filed Vitals:   12/17/14 2200 12/17/14 2358 12/18/14 0333 12/18/14 0700  BP: 138/58 131/55 130/52 128/55  Pulse: 69 79 93   Temp:  97.7 F (36.5 C) 97.7 F (36.5 C)   TempSrc:  Oral Oral   Resp: 18 20 22 19   SpO2: 98% 97% 97% 92%    Intake/Output Summary (Last 24 hours) at 12/18/14 0802 Last data filed at 12/18/14 0334  Gross per 24 hour  Intake   1100 ml  Output    860 ml  Net    240 ml    Physical Exam: A&Ox3, NAD Face: No facial droop, tongue midline Neck: Trachea midline, incision clean dry and intact with dermabond CV: RRR Pulmonary: CTA Bilaterally Abdomen: Soft, Nontender, Nondistended Extremity: Neuro, vascular, muscular intact - equal strength upper, lower, right and left   Laboratory: CBC    Component Value Date/Time   WBC 7.5 12/18/2014 0436   WBC 7.4 09/26/2012 1340   HGB 11.5* 12/18/2014 0436   HGB 13.4 09/26/2012 1340   HCT 34.8* 12/18/2014 0436   HCT 39.0 09/26/2012 1340   PLT 153 12/18/2014 0436   PLT 239 09/26/2012 1340    BMET    Component Value Date/Time   NA 143 12/18/2014 0436   NA 144 09/26/2012 1340   K 4.2 12/18/2014 0436   K 3.4* 09/26/2012 1340   CL 113* 12/18/2014 0436   CL 111* 09/26/2012 1340   CO2 22 12/18/2014 0436   CO2 26 09/26/2012 1340   GLUCOSE 104* 12/18/2014 0436   GLUCOSE 116* 09/26/2012 1340   BUN 15 12/18/2014 0436   BUN 21* 09/26/2012 1340   CREATININE 0.86 12/18/2014 0436   CREATININE 1.31* 09/26/2012 1340   CALCIUM 8.2* 12/18/2014 0436   CALCIUM 9.1 09/26/2012 1340   GFRNONAA >60 12/18/2014 0436   GFRNONAA 39* 09/26/2012 1340   GFRAA >60 12/18/2014 0436   GFRAA 45* 09/26/2012 1340    Assessment/Planning: 81 year old female Left  Carotid Endarterectomy for high grade carotid stenosis doing well 1) Patient may be discharged home if she passes her trial of void, is tolerating a regular diet, her pain is controlled by PO medications and she is ambulating well   [employer] PA-C 12/18/2014 8:02 AM

## 2015-02-02 DIAGNOSIS — E782 Mixed hyperlipidemia: Secondary | ICD-10-CM | POA: Diagnosis not present

## 2015-02-02 DIAGNOSIS — I6523 Occlusion and stenosis of bilateral carotid arteries: Secondary | ICD-10-CM | POA: Diagnosis not present

## 2015-02-02 DIAGNOSIS — I10 Essential (primary) hypertension: Secondary | ICD-10-CM | POA: Diagnosis not present

## 2015-03-13 DIAGNOSIS — E782 Mixed hyperlipidemia: Secondary | ICD-10-CM | POA: Diagnosis not present

## 2015-03-20 DIAGNOSIS — E782 Mixed hyperlipidemia: Secondary | ICD-10-CM | POA: Diagnosis not present

## 2015-03-20 DIAGNOSIS — M8580 Other specified disorders of bone density and structure, unspecified site: Secondary | ICD-10-CM | POA: Diagnosis not present

## 2015-03-20 DIAGNOSIS — I10 Essential (primary) hypertension: Secondary | ICD-10-CM | POA: Diagnosis not present

## 2015-03-20 DIAGNOSIS — J069 Acute upper respiratory infection, unspecified: Secondary | ICD-10-CM | POA: Diagnosis not present

## 2015-03-20 DIAGNOSIS — K219 Gastro-esophageal reflux disease without esophagitis: Secondary | ICD-10-CM | POA: Diagnosis not present

## 2015-03-27 DIAGNOSIS — R05 Cough: Secondary | ICD-10-CM | POA: Diagnosis not present

## 2015-03-27 DIAGNOSIS — J189 Pneumonia, unspecified organism: Secondary | ICD-10-CM | POA: Diagnosis not present

## 2015-03-31 DIAGNOSIS — J159 Unspecified bacterial pneumonia: Secondary | ICD-10-CM | POA: Diagnosis not present

## 2015-04-08 DIAGNOSIS — J00 Acute nasopharyngitis [common cold]: Secondary | ICD-10-CM | POA: Diagnosis not present

## 2015-06-02 DIAGNOSIS — I10 Essential (primary) hypertension: Secondary | ICD-10-CM | POA: Diagnosis not present

## 2015-06-02 DIAGNOSIS — I517 Cardiomegaly: Secondary | ICD-10-CM | POA: Diagnosis not present

## 2015-06-02 DIAGNOSIS — I6523 Occlusion and stenosis of bilateral carotid arteries: Secondary | ICD-10-CM | POA: Diagnosis not present

## 2015-06-02 DIAGNOSIS — E782 Mixed hyperlipidemia: Secondary | ICD-10-CM | POA: Diagnosis not present

## 2015-06-02 DIAGNOSIS — R0789 Other chest pain: Secondary | ICD-10-CM | POA: Diagnosis not present

## 2015-06-02 DIAGNOSIS — K219 Gastro-esophageal reflux disease without esophagitis: Secondary | ICD-10-CM | POA: Diagnosis not present

## 2015-07-15 DIAGNOSIS — E782 Mixed hyperlipidemia: Secondary | ICD-10-CM | POA: Diagnosis not present

## 2015-07-15 DIAGNOSIS — I10 Essential (primary) hypertension: Secondary | ICD-10-CM | POA: Diagnosis not present

## 2015-07-15 DIAGNOSIS — Z0000 Encounter for general adult medical examination without abnormal findings: Secondary | ICD-10-CM | POA: Diagnosis not present

## 2015-07-15 DIAGNOSIS — M8580 Other specified disorders of bone density and structure, unspecified site: Secondary | ICD-10-CM | POA: Diagnosis not present

## 2015-07-22 DIAGNOSIS — Z8639 Personal history of other endocrine, nutritional and metabolic disease: Secondary | ICD-10-CM | POA: Diagnosis not present

## 2015-07-22 DIAGNOSIS — I10 Essential (primary) hypertension: Secondary | ICD-10-CM | POA: Diagnosis not present

## 2015-07-22 DIAGNOSIS — K219 Gastro-esophageal reflux disease without esophagitis: Secondary | ICD-10-CM | POA: Diagnosis not present

## 2015-07-22 DIAGNOSIS — E782 Mixed hyperlipidemia: Secondary | ICD-10-CM | POA: Diagnosis not present

## 2015-09-28 DIAGNOSIS — I6523 Occlusion and stenosis of bilateral carotid arteries: Secondary | ICD-10-CM | POA: Diagnosis not present

## 2015-09-28 DIAGNOSIS — R05 Cough: Secondary | ICD-10-CM | POA: Diagnosis not present

## 2015-09-28 DIAGNOSIS — R0789 Other chest pain: Secondary | ICD-10-CM | POA: Diagnosis not present

## 2015-09-28 DIAGNOSIS — I10 Essential (primary) hypertension: Secondary | ICD-10-CM | POA: Diagnosis not present

## 2015-09-28 DIAGNOSIS — K219 Gastro-esophageal reflux disease without esophagitis: Secondary | ICD-10-CM | POA: Diagnosis not present

## 2015-09-28 DIAGNOSIS — E782 Mixed hyperlipidemia: Secondary | ICD-10-CM | POA: Diagnosis not present

## 2015-10-29 DIAGNOSIS — I10 Essential (primary) hypertension: Secondary | ICD-10-CM | POA: Diagnosis not present

## 2015-10-29 DIAGNOSIS — I517 Cardiomegaly: Secondary | ICD-10-CM | POA: Diagnosis not present

## 2015-10-29 DIAGNOSIS — I6523 Occlusion and stenosis of bilateral carotid arteries: Secondary | ICD-10-CM | POA: Diagnosis not present

## 2015-10-29 DIAGNOSIS — R0789 Other chest pain: Secondary | ICD-10-CM | POA: Diagnosis not present

## 2015-10-29 DIAGNOSIS — K219 Gastro-esophageal reflux disease without esophagitis: Secondary | ICD-10-CM | POA: Diagnosis not present

## 2015-10-29 DIAGNOSIS — E782 Mixed hyperlipidemia: Secondary | ICD-10-CM | POA: Diagnosis not present

## 2015-11-16 DIAGNOSIS — Z8639 Personal history of other endocrine, nutritional and metabolic disease: Secondary | ICD-10-CM | POA: Diagnosis not present

## 2015-11-16 DIAGNOSIS — E782 Mixed hyperlipidemia: Secondary | ICD-10-CM | POA: Diagnosis not present

## 2015-11-16 DIAGNOSIS — I10 Essential (primary) hypertension: Secondary | ICD-10-CM | POA: Diagnosis not present

## 2015-11-23 DIAGNOSIS — Z0000 Encounter for general adult medical examination without abnormal findings: Secondary | ICD-10-CM | POA: Diagnosis not present

## 2015-11-23 DIAGNOSIS — E782 Mixed hyperlipidemia: Secondary | ICD-10-CM | POA: Diagnosis not present

## 2015-11-23 DIAGNOSIS — Z8639 Personal history of other endocrine, nutritional and metabolic disease: Secondary | ICD-10-CM | POA: Diagnosis not present

## 2015-11-23 DIAGNOSIS — I10 Essential (primary) hypertension: Secondary | ICD-10-CM | POA: Diagnosis not present

## 2015-11-23 DIAGNOSIS — Z7185 Encounter for immunization safety counseling: Secondary | ICD-10-CM | POA: Insufficient documentation

## 2016-01-07 DIAGNOSIS — I340 Nonrheumatic mitral (valve) insufficiency: Secondary | ICD-10-CM | POA: Diagnosis not present

## 2016-01-07 DIAGNOSIS — K219 Gastro-esophageal reflux disease without esophagitis: Secondary | ICD-10-CM | POA: Diagnosis not present

## 2016-01-07 DIAGNOSIS — I6523 Occlusion and stenosis of bilateral carotid arteries: Secondary | ICD-10-CM | POA: Diagnosis not present

## 2016-01-07 DIAGNOSIS — I10 Essential (primary) hypertension: Secondary | ICD-10-CM | POA: Diagnosis not present

## 2016-01-15 DIAGNOSIS — K580 Irritable bowel syndrome with diarrhea: Secondary | ICD-10-CM | POA: Diagnosis not present

## 2016-01-15 DIAGNOSIS — Z86010 Personal history of colonic polyps: Secondary | ICD-10-CM | POA: Diagnosis not present

## 2016-02-23 ENCOUNTER — Ambulatory Visit
Admission: RE | Admit: 2016-02-23 | Discharge: 2016-02-23 | Disposition: A | Discharge status: Home / Self Care | Payer: Commercial Managed Care - HMO | Source: Ambulatory Visit | Attending: Gastroenterology | Admitting: Gastroenterology

## 2016-02-23 ENCOUNTER — Encounter
Admission: RE | Disposition: A | Discharge status: Home / Self Care | Payer: Self-pay | Source: Ambulatory Visit | Attending: Gastroenterology

## 2016-02-23 ENCOUNTER — Ambulatory Visit: Payer: Commercial Managed Care - HMO | Admitting: Anesthesiology

## 2016-02-23 DIAGNOSIS — Z980 Intestinal bypass and anastomosis status: Secondary | ICD-10-CM | POA: Diagnosis not present

## 2016-02-23 DIAGNOSIS — I10 Essential (primary) hypertension: Secondary | ICD-10-CM | POA: Diagnosis not present

## 2016-02-23 DIAGNOSIS — Z7982 Long term (current) use of aspirin: Secondary | ICD-10-CM | POA: Insufficient documentation

## 2016-02-23 DIAGNOSIS — K5790 Diverticulosis of intestine, part unspecified, without perforation or abscess without bleeding: Secondary | ICD-10-CM | POA: Diagnosis not present

## 2016-02-23 DIAGNOSIS — I6523 Occlusion and stenosis of bilateral carotid arteries: Secondary | ICD-10-CM | POA: Insufficient documentation

## 2016-02-23 DIAGNOSIS — Z79899 Other long term (current) drug therapy: Secondary | ICD-10-CM | POA: Diagnosis not present

## 2016-02-23 DIAGNOSIS — K635 Polyp of colon: Secondary | ICD-10-CM | POA: Diagnosis not present

## 2016-02-23 DIAGNOSIS — I739 Peripheral vascular disease, unspecified: Secondary | ICD-10-CM | POA: Diagnosis not present

## 2016-02-23 DIAGNOSIS — R109 Unspecified abdominal pain: Secondary | ICD-10-CM | POA: Diagnosis not present

## 2016-02-23 DIAGNOSIS — Z86010 Personal history of colonic polyps: Secondary | ICD-10-CM | POA: Diagnosis not present

## 2016-02-23 DIAGNOSIS — D123 Benign neoplasm of transverse colon: Secondary | ICD-10-CM | POA: Diagnosis not present

## 2016-02-23 DIAGNOSIS — K5730 Diverticulosis of large intestine without perforation or abscess without bleeding: Secondary | ICD-10-CM | POA: Insufficient documentation

## 2016-02-23 DIAGNOSIS — R1030 Lower abdominal pain, unspecified: Secondary | ICD-10-CM | POA: Diagnosis not present

## 2016-02-23 DIAGNOSIS — E7800 Pure hypercholesterolemia, unspecified: Secondary | ICD-10-CM | POA: Diagnosis not present

## 2016-02-23 DIAGNOSIS — K529 Noninfective gastroenteritis and colitis, unspecified: Secondary | ICD-10-CM | POA: Diagnosis not present

## 2016-02-23 HISTORY — PX: COLONOSCOPY WITH PROPOFOL: SHX5780

## 2016-02-23 SURGERY — COLONOSCOPY WITH PROPOFOL
Anesthesia: General

## 2016-02-23 MED ORDER — SODIUM CHLORIDE 0.9 % IV SOLN: INTRAVENOUS | Status: DC

## 2016-02-23 MED ORDER — PROPOFOL 500 MG/50ML IV EMUL
INTRAVENOUS | Status: DC | PRN
  Administered 2016-02-23: 120 ug/kg/min via INTRAVENOUS

## 2016-02-23 MED ORDER — SODIUM CHLORIDE 0.9 % IV SOLN
INTRAVENOUS | Status: DC
  Administered 2016-02-23: 1000 mL via INTRAVENOUS

## 2016-02-23 MED ORDER — PROPOFOL 10 MG/ML IV BOLUS
INTRAVENOUS | Status: DC | PRN
  Administered 2016-02-23: 20 mg via INTRAVENOUS

## 2016-02-23 MED ORDER — LIDOCAINE HCL (PF) 1 % IJ SOLN
INTRAMUSCULAR | Status: DC | PRN
  Administered 2016-02-23: 2 mL

## 2016-02-23 MED ORDER — FENTANYL CITRATE (PF) 100 MCG/2ML IJ SOLN
INTRAMUSCULAR | Status: DC | PRN
  Administered 2016-02-23: 25 ug via INTRAVENOUS

## 2016-02-23 NOTE — Transfer of Care (Signed)
Immediate Anesthesia Transfer of Care Note  Patient: [name]  Procedure(s) Performed: Procedure(s): COLONOSCOPY WITH PROPOFOL (N/A)  Patient Location: PACU  Anesthesia Type:General  Level of Consciousness: awake  Airway & Oxygen Therapy: Patient Spontanous Breathing and Patient connected to nasal cannula oxygen  Post-op Assessment: Report given to RN and Post -op Vital signs reviewed and stable  Post vital signs: Reviewed  Last Vitals:  Vitals:   02/23/16 0812  BP: (!) 220/80  Pulse: 97  Resp: 16  Temp: 36.4 C    Last Pain:  Vitals:   02/23/16 0812  TempSrc: Tympanic         Complications: No apparent anesthesia complications

## 2016-02-23 NOTE — Anesthesia Postprocedure Evaluation (Signed)
Anesthesia Post Note  Patient: Sharon Barajas  Procedure(s) Performed: Procedure(s) (LRB): COLONOSCOPY WITH PROPOFOL (N/A)  Patient location during evaluation: PACU Anesthesia Type: General Level of consciousness: awake Pain management: pain level controlled Vital Signs Assessment: post-procedure vital signs reviewed and stable Respiratory status: spontaneous breathing Cardiovascular status: stable Anesthetic complications: no    Last Vitals:  Vitals:   02/23/16 1040 02/23/16 1050  BP:    Pulse:    Resp: 16 20  Temp:      Last Pain:  Vitals:   02/23/16 1020  TempSrc: Tympanic                 VAN STAVEREN,Zophia Marrone

## 2016-02-23 NOTE — Anesthesia Preprocedure Evaluation (Addendum)
Anesthesia Evaluation  Patient identified by MRN, date of birth, ID band Patient awake    Reviewed: Allergy & Precautions, NPO status , Patient's Chart, lab work & pertinent test results  Airway Mallampati: II       Dental  (+) Missing   Pulmonary neg pulmonary ROS,    Pulmonary exam normal breath sounds clear to auscultation       Cardiovascular Exercise Tolerance: Good hypertension, Pt. on medications + Peripheral Vascular Disease   Rhythm:Regular Rate:Normal     Neuro/Psych negative neurological ROS  negative psych ROS   GI/Hepatic negative GI ROS, Neg liver ROS,   Endo/Other  negative endocrine ROS  Renal/GU negative Renal ROS     Musculoskeletal   Abdominal Normal abdominal exam  (+)   Peds negative pediatric ROS (+)  Hematology negative hematology ROS (+)   Anesthesia Other Findings   Reproductive/Obstetrics                            Anesthesia Physical Anesthesia Plan  ASA: III  Anesthesia Plan: General   Post-op Pain Management:    Induction: Intravenous  Airway Management Planned: Natural Airway and Nasal Cannula  Additional Equipment:   Intra-op Plan:   Post-operative Plan:   Informed Consent: I have reviewed the patients History and Physical, chart, labs and discussed the procedure including the risks, benefits and alternatives for the proposed anesthesia with the patient or authorized representative who has indicated his/her understanding and acceptance.     Plan Discussed with: CRNA  Anesthesia Plan Comments:         Anesthesia Quick Evaluation

## 2016-02-23 NOTE — H&P (Signed)
Outpatient short stay form Pre-procedure 02/23/2016 9:38 AM Lollie Sails MD  Primary Physician: Dr Meriel Flavors  Reason for visit:  Colonoscopy  History of present illness:  Patient is a 82 year old female presenting today with issues concerning chronic diarrhea as well as personal history of adenomatous colon polyps. She tolerated her prep well last week. She does take aspirin and Plavix reversal dose over the past. She does not take any other aspirin products or blood thinning agents.    Current Facility-Administered Medications:  .  0.9 %  sodium chloride infusion, , Intravenous, Continuous, Lollie Sails, MD, Last Rate: 20 mL/hr at 02/23/16 0829, 1,000 mL at 02/23/16 0829 .  0.9 %  sodium chloride infusion, , Intravenous, Continuous, Lollie Sails, MD  Prescriptions Prior to Admission  Medication Sig Dispense Refill Last Dose  . amLODipine (NORVASC) 10 MG tablet Take 10 mg by mouth every morning.   02/23/2016 at Unknown time  . aspirin 81 MG tablet Take 81 mg by mouth every morning.   Past Week at Unknown time  . clopidogrel (PLAVIX) 75 MG tablet Take 1 tablet (75 mg total) by mouth daily. 30 tablet 5 Past Week at Unknown time  . ergocalciferol (VITAMIN D2) 50000 UNITS capsule Take 50,000 Units by mouth once a week.   Past Week at Unknown time  . lisinopril (PRINIVIL,ZESTRIL) 20 MG tablet Take 20 mg by mouth at bedtime.   02/23/2016 at Unknown time  . Multiple Vitamins-Minerals (ICAPS) CAPS Take 1 capsule by mouth 2 (two) times daily.   Past Week at Unknown time  . nitroGLYCERIN (NITROSTAT) 0.4 MG SL tablet Place 0.4 mg under the tongue every 5 (five) minutes as needed for chest pain.   Past Month at Unknown time  . omeprazole (PRILOSEC) 20 MG capsule Take 20 mg by mouth 2 (two) times daily before a meal.   Past Week at Unknown time  . oxyCODONE-acetaminophen (PERCOCET/ROXICET) 5-325 MG per tablet Take 1 tablet by mouth every 6 (six) hours as needed (Pain). 20 tablet 0  Past Week at Unknown time  . potassium chloride (K-DUR) 10 MEQ tablet Take 10 mEq by mouth every morning.   Past Week at Unknown time  . simvastatin (ZOCOR) 20 MG tablet Take 20 mg by mouth daily at 6 PM.   Past Week at Unknown time     No Known Allergies   Past Medical History:  Diagnosis Date  . Carotid artery stenosis    bilateral  . 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  . Hypercholesteremia   . Hypertension     Review of systems:      Physical Exam    Heart and lungs: Regular rate and rhythm without rub or gallop, lungs are bilaterally clear.    HEENT: Normocephalic atraumatic eyes are anicteric    Other:     Pertinant exam for procedure: Soft nontender nondistended bowel sounds positive normoactive.    Planned proceedures: Colonoscopy and indicated procedures. I have discussed the risks benefits and complications of procedures to include not limited to bleeding, infection, perforation and the risk of sedation and the patient wishes to proceed.    Lollie Sails, MD Gastroenterology 02/23/2016  9:38 AM

## 2016-02-23 NOTE — Op Note (Signed)
[name] Gastroenterology Patient Name: [place] Procedure Date: 02/23/2016 9:50 AM MRN: [id number] Account #: [id number] Date of Birth: [birth year] Admit Type: Outpatient Age: 82 Room: [facility] ENDO ROOM 3 Gender: Female Note Status: Finalized Procedure:            Colonoscopy Indications:          Chronic diarrhea, Personal history of colonic polyps Providers:            Lollie Sails, MD Referring MD:         Dion Body (Referring MD) Medicines:            Monitored Anesthesia Care Complications:        No immediate complications. Procedure:            Pre-Anesthesia Assessment:                       - ASA Grade Assessment: III - A patient with severe                        systemic disease.                       After obtaining informed consent, the colonoscope was                        passed under direct vision. Throughout the procedure,                        the patient's blood pressure, pulse, and oxygen                        saturations were monitored continuously. The                        Colonoscope was introduced through the anus and                        advanced to the the cecum, identified by appendiceal                        orifice and ileocecal valve. The colonoscopy was                        performed with moderate difficulty due to a tortuous                        colon. Successful completion of the procedure was aided                        by changing the patient's position and lavage. The                        patient tolerated the procedure well. The quality of                        the bowel preparation was good. Findings:      A 2 mm polyp was found in the hepatic flexure. The polyp was flat. The       polyp was removed with a cold biopsy forceps. Resection and retrieval       were  complete.      Multiple small to medium diverticula were found in the sigmoid colon,       descending colon and transverse  colon. Biopsies for histology were taken       with a cold forceps from the right colon and left colon for evaluation       of microscopic colitis.      The exam was otherwise without abnormality.      The digital rectal exam was normal.      The retroflexed view of the distal rectum and anal verge was normal and       showed no anal or rectal abnormalities note multiple small anal pillars.      There was evidence of a prior functional end-to-end ileo-colonic       anastomosis in the distal ascending colon. This was patent and was       characterized by healthy appearing mucosa. Impression:           - One 2 mm polyp at the hepatic flexure, removed with a                        cold biopsy forceps. Resected and retrieved.                       - Diverticulosis in the sigmoid colon, in the                        descending colon and in the transverse colon. Biopsied.                       - The examination was otherwise normal. Recommendation:       - Discharge patient to home.                       - Await pathology results.                       - Return to GI office in 1 month. Procedure Code(s):    --- Professional ---                       [phone], Colonoscopy, flexible; with biopsy, single or                        multiple Diagnosis Code(s):    --- Professional ---                       D12.3, Benign neoplasm of transverse colon (hepatic                        flexure or splenic flexure)                       K52.9, Noninfective gastroenteritis and colitis,                        unspecified                       Z86.010, Personal history of colonic polyps                       K57.30, Diverticulosis of large  intestine without                        perforation or abscess without bleeding CPT copyright 2016 [employer]. All rights reserved. The codes documented in this report are preliminary and upon coder review may  be revised to meet current compliance  requirements. Lollie Sails, MD 02/23/2016 10:32:59 AM This report has been signed electronically. Number of Addenda: 0 Note Initiated On: 02/23/2016 9:50 AM Scope Withdrawal Time: 0 hours 11 minutes 55 seconds  Total Procedure Duration: 0 hours 26 minutes 16 seconds       [facility]

## 2016-02-24 ENCOUNTER — Encounter: Payer: Self-pay | Admitting: Gastroenterology

## 2016-02-24 LAB — SURGICAL PATHOLOGY

## 2016-03-02 NOTE — Addendum Note (Signed)
Addendum  created 03/02/16 1111 by Iver Nestle, MD   Delete clinical note, Sign clinical note

## 2016-03-17 DIAGNOSIS — Z8639 Personal history of other endocrine, nutritional and metabolic disease: Secondary | ICD-10-CM | POA: Diagnosis not present

## 2016-03-17 DIAGNOSIS — N289 Disorder of kidney and ureter, unspecified: Secondary | ICD-10-CM | POA: Diagnosis not present

## 2016-03-17 DIAGNOSIS — E782 Mixed hyperlipidemia: Secondary | ICD-10-CM | POA: Diagnosis not present

## 2016-03-17 DIAGNOSIS — I10 Essential (primary) hypertension: Secondary | ICD-10-CM | POA: Diagnosis not present

## 2016-03-24 DIAGNOSIS — E782 Mixed hyperlipidemia: Secondary | ICD-10-CM | POA: Diagnosis not present

## 2016-03-24 DIAGNOSIS — Z8639 Personal history of other endocrine, nutritional and metabolic disease: Secondary | ICD-10-CM | POA: Diagnosis not present

## 2016-03-24 DIAGNOSIS — K219 Gastro-esophageal reflux disease without esophagitis: Secondary | ICD-10-CM | POA: Diagnosis not present

## 2016-03-24 DIAGNOSIS — I10 Essential (primary) hypertension: Secondary | ICD-10-CM | POA: Diagnosis not present

## 2016-04-12 DIAGNOSIS — K580 Irritable bowel syndrome with diarrhea: Secondary | ICD-10-CM | POA: Diagnosis not present

## 2016-06-06 DIAGNOSIS — M25552 Pain in left hip: Secondary | ICD-10-CM | POA: Diagnosis not present

## 2016-06-06 DIAGNOSIS — S79912A Unspecified injury of left hip, initial encounter: Secondary | ICD-10-CM | POA: Diagnosis not present

## 2016-06-06 DIAGNOSIS — I10 Essential (primary) hypertension: Secondary | ICD-10-CM | POA: Diagnosis not present

## 2016-07-21 DIAGNOSIS — I6523 Occlusion and stenosis of bilateral carotid arteries: Secondary | ICD-10-CM | POA: Diagnosis not present

## 2016-07-21 DIAGNOSIS — E782 Mixed hyperlipidemia: Secondary | ICD-10-CM | POA: Diagnosis not present

## 2016-07-21 DIAGNOSIS — I10 Essential (primary) hypertension: Secondary | ICD-10-CM | POA: Diagnosis not present

## 2016-09-01 ENCOUNTER — Emergency Department
Admission: EM | Admit: 2016-09-01 | Discharge: 2016-09-01 | Disposition: A | Discharge status: Home / Self Care | Payer: Medicare HMO | Attending: Emergency Medicine | Admitting: Emergency Medicine

## 2016-09-01 DIAGNOSIS — Z79899 Other long term (current) drug therapy: Secondary | ICD-10-CM | POA: Diagnosis not present

## 2016-09-01 DIAGNOSIS — K529 Noninfective gastroenteritis and colitis, unspecified: Secondary | ICD-10-CM | POA: Diagnosis not present

## 2016-09-01 DIAGNOSIS — R112 Nausea with vomiting, unspecified: Secondary | ICD-10-CM | POA: Diagnosis present

## 2016-09-01 DIAGNOSIS — I10 Essential (primary) hypertension: Secondary | ICD-10-CM | POA: Diagnosis not present

## 2016-09-01 DIAGNOSIS — Z7982 Long term (current) use of aspirin: Secondary | ICD-10-CM | POA: Insufficient documentation

## 2016-09-01 LAB — COMPREHENSIVE METABOLIC PANEL
ALBUMIN: 3.9 g/dL (ref 3.5–5.0)
ALT: 17 U/L (ref 14–54)
ANION GAP: 9 (ref 5–15)
AST: 26 U/L (ref 15–41)
Alkaline Phosphatase: 86 U/L (ref 38–126)
BUN: 39 mg/dL — AB (ref 6–20)
CO2: 20 mmol/L — AB (ref 22–32)
Calcium: 9.7 mg/dL (ref 8.9–10.3)
Chloride: 110 mmol/L (ref 101–111)
Creatinine, Ser: 1.33 mg/dL — ABNORMAL HIGH (ref 0.44–1.00)
GFR calc Af Amer: 42 mL/min — ABNORMAL LOW (ref >60)
GFR calc non Af Amer: 36 mL/min — ABNORMAL LOW (ref >60)
GLUCOSE: 100 mg/dL — AB (ref 65–99)
POTASSIUM: 3.8 mmol/L (ref 3.5–5.1)
SODIUM: 139 mmol/L (ref 135–145)
Total Bilirubin: 0.9 mg/dL (ref 0.3–1.2)
Total Protein: 7.1 g/dL (ref 6.5–8.1)

## 2016-09-01 LAB — CBC
HEMATOCRIT: 43 % (ref 35.0–47.0)
HEMOGLOBIN: 14.4 g/dL (ref 12.0–16.0)
MCH: 30.3 pg (ref 26.0–34.0)
MCHC: 33.5 g/dL (ref 32.0–36.0)
MCV: 90.4 fL (ref 80.0–100.0)
Platelets: 204 10*3/uL (ref 150–440)
RBC: 4.75 MIL/uL (ref 3.80–5.20)
RDW: 14.9 % — AB (ref 11.5–14.5)
WBC: 6.5 10*3/uL (ref 3.6–11.0)

## 2016-09-01 LAB — LIPASE, BLOOD: Lipase: 27 U/L (ref 11–51)

## 2016-09-01 MED ORDER — ONDANSETRON 4 MG PO TBDP: 4.0000 mg | ORAL_TABLET | Freq: Three times a day (TID) | ORAL | 0 refills | Status: DC | PRN

## 2016-09-01 MED ORDER — ONDANSETRON HCL 4 MG/2ML IJ SOLN
4.0000 mg | Freq: Once | INTRAMUSCULAR | Status: AC
Start: 2016-09-01 — End: 2016-09-01
  Administered 2016-09-01: 4 mg via INTRAVENOUS
  Filled 2016-09-01: qty 2

## 2016-09-01 MED ORDER — SODIUM CHLORIDE 0.9 % IV SOLN
Freq: Once | INTRAVENOUS | Status: AC
  Administered 2016-09-01: 17:00:00 via INTRAVENOUS

## 2016-09-01 MED ORDER — LOPERAMIDE HCL 2 MG PO TABS: 2.0000 mg | ORAL_TABLET | Freq: Four times a day (QID) | ORAL | 0 refills | Status: DC | PRN

## 2016-09-01 NOTE — ED Provider Notes (Signed)
[facility] Emergency Department Provider Note       Time seen: ----------------------------------------- 4:53 PM on 09/01/2016 -----------------------------------------     I have reviewed the triage vital signs and the nursing notes.   HISTORY   Chief Complaint Abdominal Pain; Nausea; Emesis; and Diarrhea    HPI Sharon Barajas is a 83 y.o. female who presents to the ED for nausea, vomiting and diarrhea that started on Monday. She has not had any abdominal pain. She states she has not been able to be very much but she is trying to stay hydrated by drinking Pedialyte. His is not been a frequent problem for her, she denies fevers, chills or other complaints.   Past Medical History:  Diagnosis Date  . Carotid artery stenosis    bilateral  . 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  . Hypercholesteremia   . Hypertension     Patient Active Problem List   Diagnosis Date Noted  . Carotid stenosis 12/17/2014    Past Surgical History:  Procedure Laterality Date  . ABDOMINAL HYSTERECTOMY    . CHOLECYSTECTOMY    . COLON SURGERY    . COLONOSCOPY WITH PROPOFOL N/A 02/23/2016   Procedure: COLONOSCOPY WITH PROPOFOL;  Surgeon: Lollie Sails, MD;  Location: [facility] ENDOSCOPY;  Service: Endoscopy;  Laterality: N/A;  . ENDARTERECTOMY Left 12/17/2014   Procedure: ENDARTERECTOMY CAROTID;  Surgeon: Algernon Huxley, MD;  Location: [facility] ORS;  Service: Vascular;  Laterality: Left;  . INNER EAR SURGERY Left    Removal of ear tumor.    Allergies Patient has no known allergies.  Social History Social History  Substance Use Topics  . Smoking status: Never Smoker  . Smokeless tobacco: Never Used  . Alcohol use No    Review of Systems Constitutional: Negative for fever. Cardiovascular: Negative for chest pain. Respiratory: Negative for shortness of breath. Gastrointestinal: Negative for abdominal pain,Positive for  vomiting and diarrhea Genitourinary: Negative for dysuria. Musculoskeletal: Negative for back pain. Skin: Negative for rash. Neurological: Negative for headaches, focal weakness or numbness.  All systems negative/normal/unremarkable except as stated in the HPI  ____________________________________________   PHYSICAL EXAM:  VITAL SIGNS: ED Triage Vitals  Enc Vitals Group     BP 09/01/16 1616 (!) 167/59     Pulse Rate 09/01/16 1616 (!) 105     Resp 09/01/16 1616 18     Temp 09/01/16 1616 98.5 F (36.9 C)     Temp Source 09/01/16 1616 Oral     SpO2 09/01/16 1616 96 %     Weight 09/01/16 1616 110 lb (49.9 kg)     Height 09/01/16 1616 5\' 1"  (1.549 m)     Head Circumference --      Peak Flow --      Pain Score 09/01/16 1615 4     Pain Loc --      Pain Edu? --      Excl. in [place]? --     Constitutional: Alert and oriented. Well appearing and in no distress. Eyes: Conjunctivae are normal. Normal extraocular movements. ENT   Head: Normocephalic and atraumatic.   Nose: No congestion/rhinnorhea.   Mouth/Throat: Mucous membranes are moist.   Neck: No stridor. Cardiovascular: Normal rate, regular rhythm. No murmurs, rubs, or gallops. Respiratory: Normal respiratory effort without tachypnea nor retractions. Breath sounds are clear and equal bilaterally. No wheezes/rales/rhonchi. Gastrointestinal: Soft and nontender. Normal bowel sounds Musculoskeletal: Nontender with normal range  of motion in extremities. No lower extremity tenderness nor edema. Neurologic:  Normal speech and language. No gross focal neurologic deficits are appreciated.  Skin:  Skin is warm, dry and intact. No rash noted. Psychiatric: Mood and affect are normal. Speech and behavior are normal.  ____________________________________________  ED COURSE:  Pertinent labs & imaging results that were available during my care of the patient were reviewed by me and considered in my medical decision making (see  chart for details). Patient presents for Vomiting and diarrhea, we will assess with labs and imaging as indicated.   Procedures ____________________________________________   LABS (pertinent positives/negatives)  Labs Reviewed  COMPREHENSIVE METABOLIC PANEL - Abnormal; Notable for the following:       Result Value   CO2 20 (*)    Glucose, Bld 100 (*)    BUN 39 (*)    Creatinine, Ser 1.33 (*)    GFR calc non Af Amer 36 (*)    GFR calc Af Amer 42 (*)    All other components within normal limits  CBC - Abnormal; Notable for the following:    RDW 14.9 (*)    All other components within normal limits  LIPASE, BLOOD  URINALYSIS, COMPLETE (UACMP) WITH MICROSCOPIC   ___________________________________________  FINAL ASSESSMENT AND PLAN  Gastroenteritis  Plan: Patient had presented for vomiting and diarrhea, currently improved after IV fluid, antiemetics and antidiarrheal agents. She has tolerated liquids by mouth well. She is stable for outpatient follow-up with her doctor.   Earleen Newport, MD   Note: This note was generated in part or whole with voice recognition software. Voice recognition is usually quite accurate but there are transcription errors that can and very often do occur. I apologize for any typographical errors that were not detected and corrected.     Earleen Newport, MD 09/01/16 [phone]

## 2016-09-01 NOTE — ED Triage Notes (Signed)
Pt c/o NVD and abdominal pain that started on Monday. Pt states she hasn't been able to eat much. Pt states she has been trying to stay hydrated. Pt in NAD at this time and respirations even and unlabored.

## 2016-09-20 DIAGNOSIS — Z8639 Personal history of other endocrine, nutritional and metabolic disease: Secondary | ICD-10-CM | POA: Diagnosis not present

## 2016-09-20 DIAGNOSIS — I10 Essential (primary) hypertension: Secondary | ICD-10-CM | POA: Diagnosis not present

## 2016-09-20 DIAGNOSIS — M8580 Other specified disorders of bone density and structure, unspecified site: Secondary | ICD-10-CM | POA: Diagnosis not present

## 2016-09-20 DIAGNOSIS — E782 Mixed hyperlipidemia: Secondary | ICD-10-CM | POA: Diagnosis not present

## 2016-09-27 DIAGNOSIS — Z0000 Encounter for general adult medical examination without abnormal findings: Secondary | ICD-10-CM | POA: Insufficient documentation

## 2016-09-27 DIAGNOSIS — E782 Mixed hyperlipidemia: Secondary | ICD-10-CM | POA: Diagnosis not present

## 2016-09-27 DIAGNOSIS — M8580 Other specified disorders of bone density and structure, unspecified site: Secondary | ICD-10-CM | POA: Diagnosis not present

## 2016-09-27 DIAGNOSIS — J301 Allergic rhinitis due to pollen: Secondary | ICD-10-CM | POA: Diagnosis not present

## 2016-09-27 DIAGNOSIS — N289 Disorder of kidney and ureter, unspecified: Secondary | ICD-10-CM | POA: Diagnosis not present

## 2016-09-27 DIAGNOSIS — Z8639 Personal history of other endocrine, nutritional and metabolic disease: Secondary | ICD-10-CM | POA: Diagnosis not present

## 2016-09-27 DIAGNOSIS — I10 Essential (primary) hypertension: Secondary | ICD-10-CM | POA: Diagnosis not present

## 2016-09-27 DIAGNOSIS — Z780 Asymptomatic menopausal state: Secondary | ICD-10-CM | POA: Diagnosis not present

## 2016-10-12 DIAGNOSIS — M810 Age-related osteoporosis without current pathological fracture: Secondary | ICD-10-CM | POA: Diagnosis not present

## 2017-01-11 DIAGNOSIS — I10 Essential (primary) hypertension: Secondary | ICD-10-CM | POA: Diagnosis not present

## 2017-01-11 DIAGNOSIS — I340 Nonrheumatic mitral (valve) insufficiency: Secondary | ICD-10-CM | POA: Diagnosis not present

## 2017-01-11 DIAGNOSIS — E782 Mixed hyperlipidemia: Secondary | ICD-10-CM | POA: Diagnosis not present

## 2017-01-11 DIAGNOSIS — I6523 Occlusion and stenosis of bilateral carotid arteries: Secondary | ICD-10-CM | POA: Diagnosis not present

## 2017-01-20 DIAGNOSIS — Z8639 Personal history of other endocrine, nutritional and metabolic disease: Secondary | ICD-10-CM | POA: Diagnosis not present

## 2017-01-20 DIAGNOSIS — M8580 Other specified disorders of bone density and structure, unspecified site: Secondary | ICD-10-CM | POA: Diagnosis not present

## 2017-01-20 DIAGNOSIS — M818 Other osteoporosis without current pathological fracture: Secondary | ICD-10-CM | POA: Diagnosis not present

## 2017-01-20 DIAGNOSIS — I10 Essential (primary) hypertension: Secondary | ICD-10-CM | POA: Diagnosis not present

## 2017-01-20 DIAGNOSIS — E782 Mixed hyperlipidemia: Secondary | ICD-10-CM | POA: Diagnosis not present

## 2017-01-20 DIAGNOSIS — N289 Disorder of kidney and ureter, unspecified: Secondary | ICD-10-CM | POA: Diagnosis not present

## 2017-01-27 DIAGNOSIS — Z0000 Encounter for general adult medical examination without abnormal findings: Secondary | ICD-10-CM | POA: Diagnosis not present

## 2017-01-27 DIAGNOSIS — Z8639 Personal history of other endocrine, nutritional and metabolic disease: Secondary | ICD-10-CM | POA: Diagnosis not present

## 2017-01-27 DIAGNOSIS — I10 Essential (primary) hypertension: Secondary | ICD-10-CM | POA: Diagnosis not present

## 2017-01-27 DIAGNOSIS — E782 Mixed hyperlipidemia: Secondary | ICD-10-CM | POA: Diagnosis not present

## 2017-01-27 DIAGNOSIS — J069 Acute upper respiratory infection, unspecified: Secondary | ICD-10-CM | POA: Diagnosis not present

## 2017-01-31 DIAGNOSIS — J069 Acute upper respiratory infection, unspecified: Secondary | ICD-10-CM | POA: Diagnosis not present

## 2017-02-06 DIAGNOSIS — J181 Lobar pneumonia, unspecified organism: Secondary | ICD-10-CM | POA: Diagnosis not present

## 2017-02-06 DIAGNOSIS — R05 Cough: Secondary | ICD-10-CM | POA: Diagnosis not present

## 2017-02-17 DIAGNOSIS — Z8701 Personal history of pneumonia (recurrent): Secondary | ICD-10-CM | POA: Diagnosis not present

## 2017-02-17 DIAGNOSIS — I10 Essential (primary) hypertension: Secondary | ICD-10-CM | POA: Diagnosis not present

## 2017-06-13 DIAGNOSIS — E782 Mixed hyperlipidemia: Secondary | ICD-10-CM | POA: Diagnosis not present

## 2017-06-13 DIAGNOSIS — M818 Other osteoporosis without current pathological fracture: Secondary | ICD-10-CM | POA: Diagnosis not present

## 2017-06-13 DIAGNOSIS — I10 Essential (primary) hypertension: Secondary | ICD-10-CM | POA: Diagnosis not present

## 2017-06-13 DIAGNOSIS — Z8639 Personal history of other endocrine, nutritional and metabolic disease: Secondary | ICD-10-CM | POA: Diagnosis not present

## 2017-06-20 DIAGNOSIS — E782 Mixed hyperlipidemia: Secondary | ICD-10-CM | POA: Diagnosis not present

## 2017-06-20 DIAGNOSIS — Z8639 Personal history of other endocrine, nutritional and metabolic disease: Secondary | ICD-10-CM | POA: Diagnosis not present

## 2017-06-20 DIAGNOSIS — K219 Gastro-esophageal reflux disease without esophagitis: Secondary | ICD-10-CM | POA: Diagnosis not present

## 2017-06-20 DIAGNOSIS — I10 Essential (primary) hypertension: Secondary | ICD-10-CM | POA: Diagnosis not present

## 2017-06-20 IMAGING — CT CT ANGIO NECK
2 of 7 series · 9 of 33 positions shown · IV contrast (APPLIED)
Comparison: Ultrasound of the carotid 10/31/2014.

CLINICAL DATA: Neck pain for months. Suspected BILATERAL carotid
stenosis. History of hypertension.

EXAM:
CT ANGIOGRAPHY NECK
TECHNIQUE: Multidetector CT imaging of the neck was performed using the
standard protocol during bolus administration of intravenous
contrast. Multiplanar CT image reconstructions and MIPs were
obtained to evaluate the vascular anatomy. Carotid stenosis
measurements (when applicable) are obtained utilizing NASCET
criteria, using the distal internal carotid diameter as the
denominator.
CONTRAST:  65mL OMNIPAQUE IOHEXOL 350 MG/ML SOLN

[Series 4: cta neck · axial · 0.37mm/px · z∈[-298,-172]mm · 4 of 140 slices shown]
[im 28/140  soft-tissue]
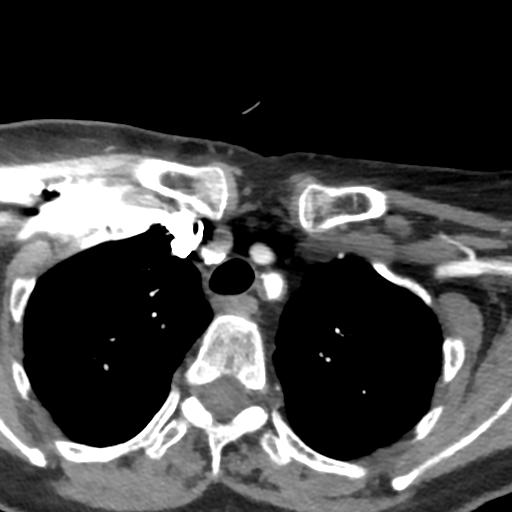
[im 56/140  soft-tissue]
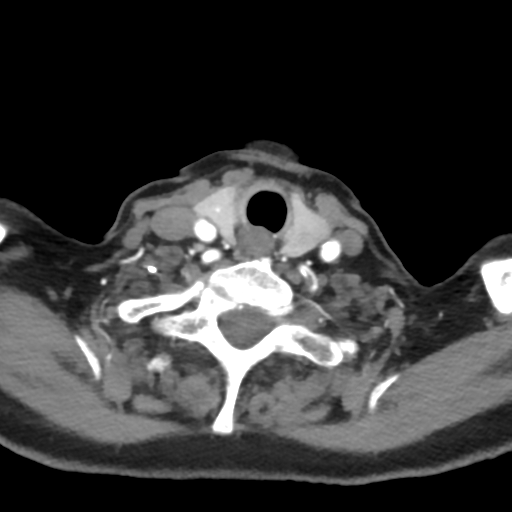
[im 84/140  soft-tissue]
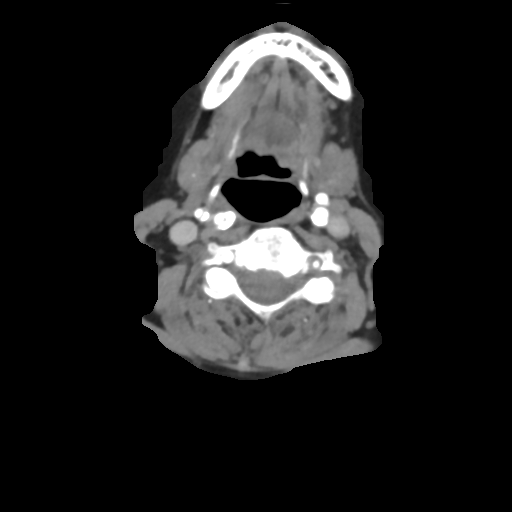
[im 112/140  soft-tissue]
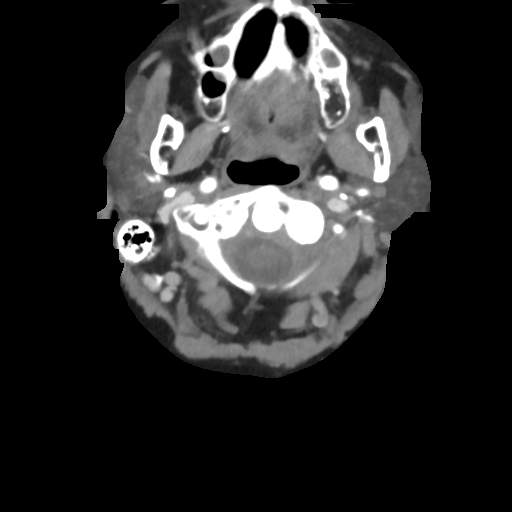

[Series 5: ax thin · axial · 0.36mm/px · z∈[-304,-168]mm · 5 of 205 slices shown]
[im 35/205  soft-tissue]
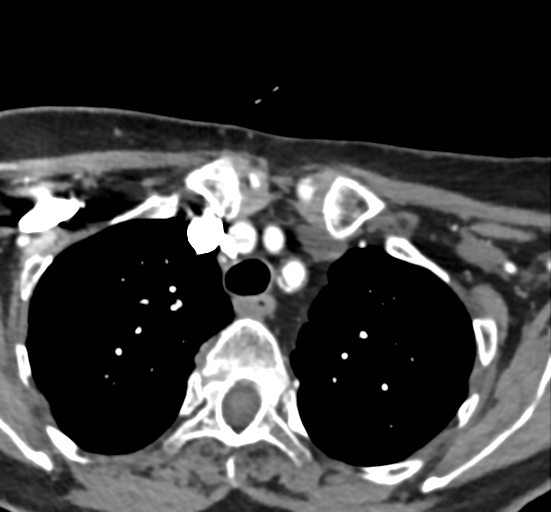
[im 69/205  bone]
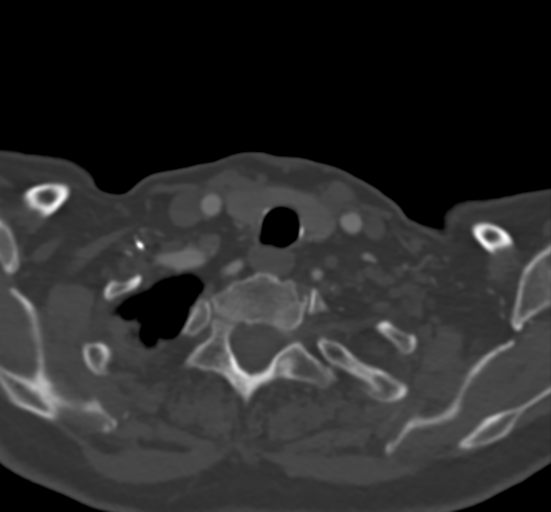
[im 103/205  soft-tissue]
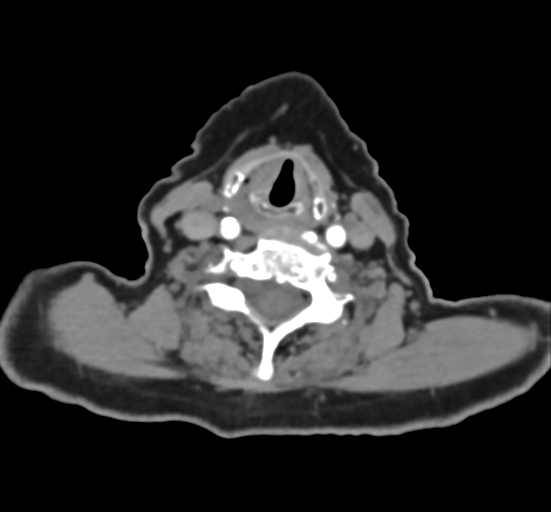
[im 137/205  bone]
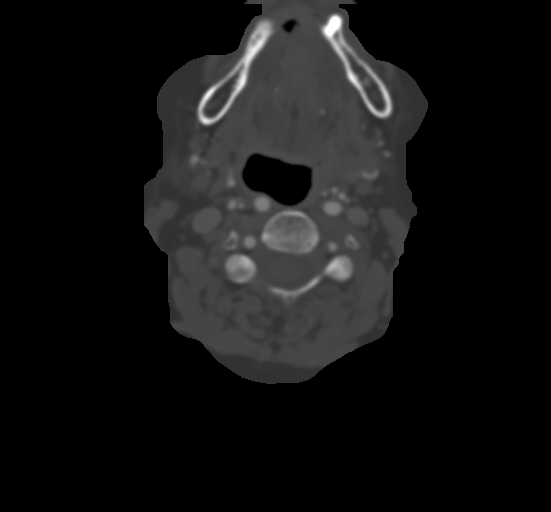
[im 171/205  soft-tissue]
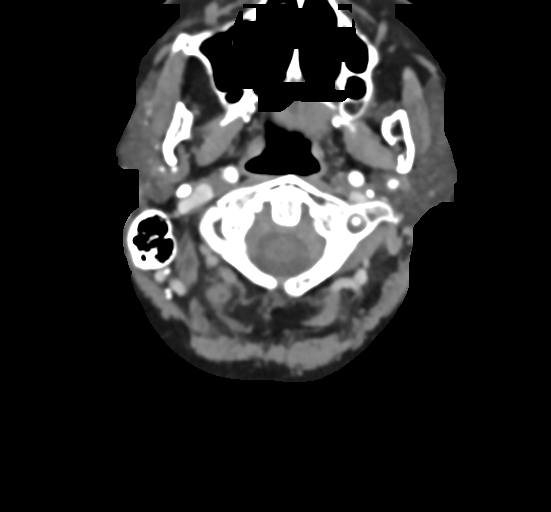

[9 of 33 positions shown; findings below may reference images not displayed]

FINDINGS: Aortic arch: Standard branching. Distal to the LEFT subclavian
origin, there are small outpouchings from a heavily calcified
transverse arch aorta inferiorly and leftward projecting,
representing a penetrating ulcers or small saccular aneurysms. No
surrounding hemorrhage or associated dissection. Consider six-month
CTA chest followup. Alternately vascular surgery consultation could
be warranted if concern for comorbidities such as severe
hypertension. No significant stenosis of the major arch vessel
origins.

Right carotid system: Predominantly calcific plaque, but some soft
plaque is noted at the bifurcation, extending into the proximal
RIGHT internal carotid artery for nearly 2 cm. A slightly greater
than 50% stenosis is present, based on luminal diameter measurements
of 2.1/4.3 proximal/distal. No dissection or fibromuscular disease.

Left carotid system: Predominantly calcific plaque, but some soft
plaque also, is noted at the bifurcation, extending into the
proximal LEFT internal carotid artery for approximately 1.5 cm. A
flow-limiting stenosis of estimated 75-80% is present, based on
luminal measurements of 1.1/4.7 proximal/distal. Cervical ICA on the
LEFT is dolichoectatic and projects into the retropharyngeal soft
tissues

Vertebral arteries: RIGHT vertebral is dominant. LEFT vertebral is
small but patent. Calcific ostial plaque at the origin of the LEFT
vertebral potentially flow reducing.

Other neck: Biapical pleural thickening. No emphysematous change. No
neck masses. Tiny RIGHT thyroid calcification. No worrisome osseous
lesion. Poor dentition. Cervical spondylosis. Degenerative scoliosis
convex LEFT mid cervical region.

Incompletely evaluated is the intracranial compartment. There are
small curvilinear radiodense structures in the LEFT neck, lateral to
the internal jugular vein, with additional similar radiodense
objects lying along the floor the middle cranial fossa. These likely
represent areas of glue or metallic coils from previous glomus tumor
embolization. Previous LEFT middle ear/mastoid surgery.
IMPRESSION: Flow reducing 75-80% stenosis LEFT internal carotid artery origin.
Conglomerate calcific and soft plaque. No dissection.

Non flow reducing predominantly calcified plaque at the RIGHT
internal carotid artery origin, slightly greater than 50% stenosis.
No dissection.

Non dominant LEFT vertebral demonstrates calcified ostial stenosis,
potentially flow reducing.

Small outpouchings from the transverse arch representing either
penetrating ulcers or small saccular aneurysms. No features to
suggest acuity. Consider six-month CTA chest follow-up.

## 2017-07-12 DIAGNOSIS — E782 Mixed hyperlipidemia: Secondary | ICD-10-CM | POA: Diagnosis not present

## 2017-07-12 DIAGNOSIS — I340 Nonrheumatic mitral (valve) insufficiency: Secondary | ICD-10-CM | POA: Diagnosis not present

## 2017-07-12 DIAGNOSIS — I10 Essential (primary) hypertension: Secondary | ICD-10-CM | POA: Diagnosis not present

## 2017-07-12 DIAGNOSIS — I6523 Occlusion and stenosis of bilateral carotid arteries: Secondary | ICD-10-CM | POA: Diagnosis not present

## 2017-07-26 ENCOUNTER — Other Ambulatory Visit (INDEPENDENT_AMBULATORY_CARE_PROVIDER_SITE_OTHER): Payer: Medicare HMO

## 2017-07-26 ENCOUNTER — Ambulatory Visit (INDEPENDENT_AMBULATORY_CARE_PROVIDER_SITE_OTHER): Payer: Medicare HMO | Admitting: Vascular Surgery

## 2017-07-26 ENCOUNTER — Encounter (INDEPENDENT_AMBULATORY_CARE_PROVIDER_SITE_OTHER): Payer: Self-pay | Admitting: Vascular Surgery

## 2017-07-26 ENCOUNTER — Other Ambulatory Visit (INDEPENDENT_AMBULATORY_CARE_PROVIDER_SITE_OTHER): Payer: Self-pay | Admitting: Vascular Surgery

## 2017-07-26 VITALS — BP 196/84 | HR 87 | Resp 13 | Ht 59.5 in | Wt 112.0 lb

## 2017-07-26 DIAGNOSIS — I779 Disorder of arteries and arterioles, unspecified: Secondary | ICD-10-CM | POA: Diagnosis not present

## 2017-07-26 DIAGNOSIS — I739 Peripheral vascular disease, unspecified: Principal | ICD-10-CM

## 2017-07-26 DIAGNOSIS — E785 Hyperlipidemia, unspecified: Secondary | ICD-10-CM | POA: Diagnosis not present

## 2017-07-26 DIAGNOSIS — I6523 Occlusion and stenosis of bilateral carotid arteries: Secondary | ICD-10-CM

## 2017-07-26 DIAGNOSIS — I10 Essential (primary) hypertension: Secondary | ICD-10-CM | POA: Insufficient documentation

## 2017-07-26 NOTE — Progress Notes (Signed)
Subjective:    Patient ID: Sharon Barajas, female    DOB: 29-Mar-1932, 84 y.o.   MRN: [id number] Chief Complaint  Patient presents with  . Follow-up    Bilateral carotid u/s f/u   Patient has not been seen in over a year.  Our office received a request to renew the patient's Plavix.  An appointment was made for the patient to undergo a bilateral carotid artery duplex as the last imaging was completed in 11/19/2014 (CTA of the neck).  Patient states that she was told by Dr. Netty Starring she did not need to follow-up in our office for her carotid stenosis? The patient underwent a bilateral carotid duplex scan which showed minimal change from the previous exam on 11/19/14. Duplex is stable at Right ICA stenosis (60-79%) and Left ICA stenosis: No hemodynamically significant stenosis.  Bilateral vertebral arteries demonstrate antegrade flow.  Normal flow hemodynamics were seen in the bilateral subclavian arteries.. The patient denies experiencing Amaurosis Fugax, TIA like symptoms or focal motor deficits. The patient continues a regimen of ASA, Plavix and dyslipidemia medication.  Patient denies fever, nausea or vomiting.  Review of Systems  Constitutional: Negative.   HENT: Negative.   Eyes: Negative.   Respiratory: Negative.   Cardiovascular:       Carotid artery stenosis Hypertension Hyperlipidemia Coronary artery disease  Gastrointestinal: Negative.   Endocrine: Negative.   Genitourinary: Negative.   Musculoskeletal: Negative.   Skin: Negative.   Allergic/Immunologic: Negative.   Neurological: Negative.   Hematological: Negative.   Psychiatric/Behavioral: Negative.       Objective:   Physical Exam  Constitutional: She is oriented to person, place, and time. She appears well-developed and well-nourished. No distress.  HENT:  Head: Normocephalic and atraumatic.  Right Ear: External ear normal.  Left Ear: External ear normal.  Eyes: Pupils are equal, round, and reactive to light.  Conjunctivae and EOM are normal.  Neck: Normal range of motion.  Right carotid bruit noted on exam  Cardiovascular: Normal rate, regular rhythm and normal heart sounds.  Pulses:      Radial pulses are 2+ on the right side, and 2+ on the left side.  Pulmonary/Chest: Effort normal and breath sounds normal.  Musculoskeletal: Normal range of motion. She exhibits no edema.  Neurological: She is alert and oriented to person, place, and time.  Skin: Skin is warm and dry. She is not diaphoretic.  Psychiatric: She has a normal mood and affect. Her behavior is normal. Judgment and thought content normal.  Vitals reviewed.  BP (!) 196/84 (BP Location: Right Arm, Patient Position: Sitting)   Pulse 87   Resp 13   Ht 4' 11.5" (1.511 m)   Wt 112 lb (50.8 kg)   BMI 22.24 kg/m   Past Medical History:  Diagnosis Date  . Carotid artery stenosis    bilateral  . 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  . Hypercholesteremia   . Hypertension    Social History   Socioeconomic History  . Marital status: Widowed    Spouse name: Not on file  . Number of children: Not on file  . Years of education: Not on file  . Highest education level: Not on file  Occupational History  . Not on file  Social Needs  . Financial resource strain: Not on file  . Food insecurity:    Worry: Not on file    Inability: Not on file  . Transportation  needs:    Medical: Not on file    Non-medical: Not on file  Tobacco Use  . Smoking status: Never Smoker  . Smokeless tobacco: Never Used  Substance and Sexual Activity  . Alcohol use: No  . Drug use: No  . Sexual activity: Yes    Birth control/protection: Surgical  Lifestyle  . Physical activity:    Days per week: Not on file    Minutes per session: Not on file  . Stress: Not on file  Relationships  . Social connections:    Talks on phone: Not on file    Gets together: Not on file    Attends religious service: Not on file      Active member of club or organization: Not on file    Attends meetings of clubs or organizations: Not on file    Relationship status: Not on file  . Intimate partner violence:    Fear of current or ex partner: Not on file    Emotionally abused: Not on file    Physically abused: Not on file    Forced sexual activity: Not on file  Other Topics Concern  . Not on file  Social History Narrative  . Not on file   Past Surgical History:  Procedure Laterality Date  . ABDOMINAL HYSTERECTOMY    . CHOLECYSTECTOMY    . COLON SURGERY    . COLONOSCOPY WITH PROPOFOL N/A 02/23/2016   Procedure: COLONOSCOPY WITH PROPOFOL;  Surgeon: Lollie Sails, MD;  Location: [facility] ENDOSCOPY;  Service: Endoscopy;  Laterality: N/A;  . ENDARTERECTOMY Left 12/17/2014   Procedure: ENDARTERECTOMY CAROTID;  Surgeon: Algernon Huxley, MD;  Location: [facility] ORS;  Service: Vascular;  Laterality: Left;  . INNER EAR SURGERY Left    Removal of ear tumor.   History reviewed. No pertinent family history.  No Known Allergies     Assessment & Plan:  Patient has not been seen in over a year.  Our office received a request to renew the patient's Plavix.  An appointment was made for the patient to undergo a bilateral carotid artery duplex as the last imaging was completed in 11/19/2014 (CTA of the neck).  Patient states that she was told by Dr. Netty Starring she did not need to follow-up in our office for her carotid stenosis? The patient underwent a bilateral carotid duplex scan which showed minimal change from the previous exam on 11/19/14. Duplex is stable at Right ICA stenosis (60-79%) and Left ICA stenosis: No hemodynamically significant stenosis.  Bilateral vertebral arteries demonstrate antegrade flow.  Normal flow hemodynamics were seen in the bilateral subclavian arteries.. The patient denies experiencing Amaurosis Fugax, TIA like symptoms or focal motor deficits. The patient continues a regimen of ASA, Plavix and dyslipidemia  medication.  Patient denies fever, nausea or vomiting.  1. Essential hypertension - Stable Encouraged good control as its slows the progression of atherosclerotic disease  2. Hyperlipidemia, unspecified hyperlipidemia type - Stable Encouraged good control as its slows the progression of atherosclerotic disease  3. Bilateral carotid artery stenosis - Stable Patient with right internal carotid artery stenosis at the high end of 75 to 80%. This is stable when compared to a CTA of the neck completed in August 2016 The patient is currently on aspirin, Plavix and a statin. Bring the patient back every 6 months to surveilled her carotid artery stenosis. She understands that if progression of her disease should occur she would be a candidate for repair. At this point the patient is  asymptomatic. I have discussed with the patient at length the risk factors for and pathogenesis of atherosclerotic disease and encouraged a healthy diet, regular exercise regimen and blood pressure / glucose control.  Patient was instructed to contact our office in the interim with problems such as arm / leg weakness or numbness, speech / swallowing difficulty or temporary monocular blindness. The patient expresses their understanding.   - VAS US CAROTID; Future  Current Outpatient Medications on File Prior to Visit  Medication Sig Dispense Refill  . amLODipine (NORVASC) 10 MG tablet Take 10 mg by mouth every morning.    Marland Kitchen aspirin 81 MG tablet Take 81 mg by mouth every morning.    Marland Kitchen atorvastatin (LIPITOR) 10 MG tablet     . Calcium Carbonate-Vitamin D 600-400 MG-UNIT tablet Take by mouth.    . clopidogrel (PLAVIX) 75 MG tablet Take 1 tablet (75 mg total) by mouth daily. 30 tablet 5  . ergocalciferol (VITAMIN D2) 50000 UNITS capsule Take 50,000 Units by mouth once a week.    Marland Kitchen lisinopril (PRINIVIL,ZESTRIL) 20 MG tablet Take 20 mg by mouth at bedtime.    Marland Kitchen loperamide (IMODIUM A-D) 2 MG tablet Take 1 tablet (2 mg total)  by mouth 4 (four) times daily as needed for diarrhea or loose stools. 30 tablet 0  . Multiple Vitamins-Minerals (ICAPS) CAPS Take 1 capsule by mouth 2 (two) times daily.    . nitroGLYCERIN (NITROSTAT) 0.4 MG SL tablet Place 0.4 mg under the tongue every 5 (five) minutes as needed for chest pain.    Marland Kitchen omeprazole (PRILOSEC) 20 MG capsule Take 20 mg by mouth 2 (two) times daily before a meal.    . ondansetron (ZOFRAN ODT) 4 MG disintegrating tablet Take 1 tablet (4 mg total) by mouth every 8 (eight) hours as needed for nausea or vomiting. 20 tablet 0  . oxyCODONE-acetaminophen (PERCOCET/ROXICET) 5-325 MG per tablet Take 1 tablet by mouth every 6 (six) hours as needed (Pain). 20 tablet 0  . potassium chloride (K-DUR) 10 MEQ tablet Take 10 mEq by mouth every morning.     No current facility-administered medications on file prior to visit.     There are no Patient Instructions on file for this visit. No follow-ups on file.   Jocelyn Lowery A Armiyah Capron, PA-C

## 2017-12-21 DIAGNOSIS — M818 Other osteoporosis without current pathological fracture: Secondary | ICD-10-CM | POA: Diagnosis not present

## 2017-12-21 DIAGNOSIS — E782 Mixed hyperlipidemia: Secondary | ICD-10-CM | POA: Diagnosis not present

## 2017-12-21 DIAGNOSIS — K219 Gastro-esophageal reflux disease without esophagitis: Secondary | ICD-10-CM | POA: Diagnosis not present

## 2017-12-21 DIAGNOSIS — Z8639 Personal history of other endocrine, nutritional and metabolic disease: Secondary | ICD-10-CM | POA: Diagnosis not present

## 2017-12-21 DIAGNOSIS — I10 Essential (primary) hypertension: Secondary | ICD-10-CM | POA: Diagnosis not present

## 2018-01-16 ENCOUNTER — Encounter (INDEPENDENT_AMBULATORY_CARE_PROVIDER_SITE_OTHER): Payer: Medicare HMO

## 2018-01-16 ENCOUNTER — Ambulatory Visit (INDEPENDENT_AMBULATORY_CARE_PROVIDER_SITE_OTHER): Payer: Medicare HMO

## 2018-01-16 ENCOUNTER — Ambulatory Visit (INDEPENDENT_AMBULATORY_CARE_PROVIDER_SITE_OTHER): Payer: Medicare HMO | Admitting: Vascular Surgery

## 2018-01-16 ENCOUNTER — Encounter (INDEPENDENT_AMBULATORY_CARE_PROVIDER_SITE_OTHER): Payer: Self-pay | Admitting: Vascular Surgery

## 2018-01-16 VITALS — BP 171/75 | HR 96 | Resp 17 | Ht 59.0 in | Wt 109.0 lb

## 2018-01-16 DIAGNOSIS — I6523 Occlusion and stenosis of bilateral carotid arteries: Secondary | ICD-10-CM | POA: Diagnosis not present

## 2018-01-16 DIAGNOSIS — E785 Hyperlipidemia, unspecified: Secondary | ICD-10-CM | POA: Diagnosis not present

## 2018-01-16 DIAGNOSIS — I10 Essential (primary) hypertension: Secondary | ICD-10-CM

## 2018-01-16 NOTE — Assessment & Plan Note (Signed)
lipid control important in reducing the progression of atherosclerotic disease. Continue statin therapy  

## 2018-01-16 NOTE — Assessment & Plan Note (Signed)
blood pressure control important in reducing the progression of atherosclerotic disease. On appropriate oral medications.  

## 2018-01-16 NOTE — Assessment & Plan Note (Signed)
Carotid duplex today reveals a patent left carotid endarterectomy and stable 60 to 79% right carotid artery stenosis.  Continue current medical regimen.  Recheck in 6 months.

## 2018-01-16 NOTE — Patient Instructions (Signed)
Carotid Artery Disease The carotid arteries are arteries on both sides of the neck. They carry blood to the brain. Carotid artery disease is when the arteries get smaller (narrow) or get blocked. If these arteries get smaller or get blocked, you are more likely to have a stroke or warning stroke (transient ischemic attack). Follow these instructions at home:  Take medicines as told by your doctor. Make sure you understand all your medicine instructions. Do not stop your medicines without talking to your doctor first.  Follow your doctor's diet instructions. It is important to eat a healthy diet that includes plenty of: ? Fresh fruits. ? Vegetables. ? Lean meats.  Avoid: ? High-fat foods. ? High-sodium foods. ? Foods that are fried, overly processed, or have poor nutritional value.  Stay a healthy weight.  Stay active. Get at least 30 minutes of activity every day.  Do not smoke.  Limit alcohol use to: ? No more than 2 drinks a day for men. ? No more than 1 drink a day for women who are not pregnant.  Do not use illegal drugs.  Keep all doctor visits as told. Get help right away if:  You have sudden weakness or loss of feeling (numbness) on one side of the body, such as the face, arm, or leg.  You have sudden confusion.  You have trouble speaking (aphasia) or understanding.  You have sudden trouble seeing out of one or both eyes.  You have sudden trouble walking.  You have dizziness or feel like you might pass out (faint).  You have a loss of balance or your movements are not steady (uncoordinated).  You have a sudden, severe headache with no known cause.  You have trouble swallowing (dysphagia). Call your local emergency services (911 in [place] Do notdrive yourself to the clinic or hospital. This information is not intended to replace advice given to you by your health care provider. Make sure you discuss any questions you have with your health care  provider. Document Released: 03/07/2012 Document Revised: 08/27/2015 Document Reviewed: 09/19/2012 Elsevier Interactive Patient Education  2018 [employer]vier Inc.  

## 2018-01-16 NOTE — Progress Notes (Signed)
MRN : [id number]  Sharon Barajas is a 84 y.o. (12-29-1931) female who presents with chief complaint of  Chief Complaint  Patient presents with  . Follow-up    6 month Carotid follow up  .  History of Present Illness: Patient returns in follow-up of her carotid disease.  She is about 3 years status post left carotid endarterectomy.  She has no focal neurologic symptoms.  She has lost her son since her last visit and this has bothered her.  She also says her blood pressure control has been suboptimal.  Carotid duplex today reveals her right carotid stenosis to be stable in the 60 to 79% range and her left carotid endarterectomy remains patent.  Current Outpatient Medications  Medication Sig Dispense Refill  . amLODipine (NORVASC) 10 MG tablet Take 10 mg by mouth every morning.    Marland Kitchen aspirin 81 MG tablet Take 81 mg by mouth every morning.    Marland Kitchen atorvastatin (LIPITOR) 10 MG tablet     . Calcium Carbonate-Vitamin D 600-400 MG-UNIT tablet Take by mouth.    . clopidogrel (PLAVIX) 75 MG tablet Take 1 tablet (75 mg total) by mouth daily. 30 tablet 5  . ergocalciferol (VITAMIN D2) 50000 UNITS capsule Take 50,000 Units by mouth once a week.    . hydrochlorothiazide (HYDRODIURIL) 12.5 MG tablet   3  . lisinopril (PRINIVIL,ZESTRIL) 20 MG tablet Take 20 mg by mouth at bedtime.    Marland Kitchen loperamide (IMODIUM A-D) 2 MG tablet Take 1 tablet (2 mg total) by mouth 4 (four) times daily as needed for diarrhea or loose stools. 30 tablet 0  . Multiple Vitamins-Minerals (ICAPS) CAPS Take 1 capsule by mouth 2 (two) times daily.    . nitroGLYCERIN (NITROSTAT) 0.4 MG SL tablet Place 0.4 mg under the tongue every 5 (five) minutes as needed for chest pain.    Marland Kitchen omeprazole (PRILOSEC) 20 MG capsule Take 20 mg by mouth 2 (two) times daily before a meal.    . ondansetron (ZOFRAN ODT) 4 MG disintegrating tablet Take 1 tablet (4 mg total) by mouth every 8 (eight) hours as needed for nausea or vomiting. 20 tablet 0  .  oxyCODONE-acetaminophen (PERCOCET/ROXICET) 5-325 MG per tablet Take 1 tablet by mouth every 6 (six) hours as needed (Pain). 20 tablet 0  . pantoprazole (PROTONIX) 20 MG tablet   3  . potassium chloride (K-DUR) 10 MEQ tablet Take 10 mEq by mouth every morning.     No current facility-administered medications for this visit.     Past Medical History:  Diagnosis Date  . Carotid artery stenosis    bilateral  . 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  . Hypercholesteremia   . Hypertension     Past Surgical History:  Procedure Laterality Date  . ABDOMINAL HYSTERECTOMY    . CHOLECYSTECTOMY    . COLON SURGERY    . COLONOSCOPY WITH PROPOFOL N/A 02/23/2016   Procedure: COLONOSCOPY WITH PROPOFOL;  Surgeon: Lollie Sails, MD;  Location[facility] ENDOSCOPY;  Service: Endoscopy;  Laterality: N/A;  . ENDARTERECTOMY Left 12/17/2014   Procedure: ENDARTERECTOMY CAROTID;  Surgeon: Algernon Huxley, MD;  Location: [facility] ORS;  Service: Vascular;  Laterality: Left;  . INNER EAR SURGERY Left    Removal of ear tumor.    Social History Social History   Tobacco Use  . Smoking status: Never Smoker  . Smokeless tobacco: Never Used  Substance Use Topics  .  Alcohol use: No  . Drug use: No     Family History No bleeding or clotting disorders  No Known Allergies   REVIEW OF SYSTEMS (Negative unless checked)  Constitutional: [] Weight loss  [] Fever  [] Chills Cardiac: [] Chest pain   [] Chest pressure   [] Palpitations   [] Shortness of breath when laying flat   [] Shortness of breath at rest   [] Shortness of breath with exertion. Vascular:  [] Pain in legs with walking   [] Pain in legs at rest   [] Pain in legs when laying flat   [] Claudication   [] Pain in feet when walking  [] Pain in feet at rest  [] Pain in feet when laying flat   [] History of DVT   [] Phlebitis   [] Swelling in legs   [] Varicose veins   [] Non-healing ulcers Pulmonary:   [] Uses home oxygen   [] Productive cough    [] Hemoptysis   [] Wheeze  [] COPD   [] Asthma Neurologic:  [] Dizziness  [] Blackouts   [] Seizures   [] History of stroke   [] History of TIA  [] Aphasia   [] Temporary blindness   [] Dysphagia   [] Weakness or numbness in arms   [] Weakness or numbness in legs Musculoskeletal:  [x] Arthritis   [] Joint swelling   [] Joint pain   [] Low back pain Hematologic:  [] Easy bruising  [] Easy bleeding   [] Hypercoagulable state   [] Anemic  [] Hepatitis Gastrointestinal:  [] Blood in stool   [] Vomiting blood  [] Gastroesophageal reflux/heartburn   [] Difficulty swallowing. Genitourinary:  [] Chronic kidney disease   [] Difficult urination  [] Frequent urination  [] Burning with urination   [] Blood in urine Skin:  [] Rashes   [] Ulcers   [] Wounds Psychological:  [] History of anxiety   []  History of major depression.  Physical Examination  Vitals:   01/16/18 1337  BP: (!) 171/75  Pulse: 96  Resp: 17  Weight: 109 lb (49.4 kg)  Height: 4\' 11"  (1.499 m)   Body mass index is 22.02 kg/m. Gen:  WD/WN, NAD Head: [place]/AT, No temporalis wasting. Ear/Nose/Throat: Hearing grossly intact, nares w/o erythema or drainage, trachea midline Eyes: Conjunctiva clear. Sclera non-icteric Neck: Supple.  Soft right carotid bruit  Pulmonary:  Good air movement, equal and clear to auscultation bilaterally.  Cardiac: RRR, No JVD Vascular:  Vessel Right Left  Radial Palpable Palpable                       Musculoskeletal: M/S 5/5 throughout.  No deformity or atrophy. No edema. Neurologic: CN 2-12 intact. Sensation grossly intact in extremities.  Symmetrical.  Speech is fluent. Motor exam as listed above. Psychiatric: Judgment intact, Mood & affect appropriate for pt's clinical situation. Dermatologic: No rashes or ulcers noted.  No cellulitis or open wounds. Lymph : No Cervical, Axillary, or Inguinal lymphadenopathy.     CBC Lab Results  Component Value Date   WBC 6.5 09/01/2016   HGB 14.4 09/01/2016   HCT 43.0 09/01/2016   MCV  90.4 09/01/2016   PLT 204 09/01/2016    BMET    Component Value Date/Time   NA 139 09/01/2016 1617   NA 144 09/26/2012 1340   K 3.8 09/01/2016 1617   K 3.4 (L) 09/26/2012 1340   CL 110 09/01/2016 1617   CL 111 (H) 09/26/2012 1340   CO2 20 (L) 09/01/2016 1617   CO2 26 09/26/2012 1340   GLUCOSE 100 (H) 09/01/2016 1617   GLUCOSE 116 (H) 09/26/2012 1340   BUN 39 (H) 09/01/2016 1617   BUN 21 (H) 09/26/2012 1340   CREATININE  1.33 (H) 09/01/2016 1617   CREATININE 1.31 (H) 09/26/2012 1340   CALCIUM 9.7 09/01/2016 1617   CALCIUM 9.1 09/26/2012 1340   GFRNONAA 36 (L) 09/01/2016 1617   GFRNONAA 39 (L) 09/26/2012 1340   GFRAA 42 (L) 09/01/2016 1617   GFRAA 45 (L) 09/26/2012 1340   CrCl cannot be calculated (Patient's most recent lab result is older than the maximum 21 days allowed.).  COAG No results found for: INR, PROTIME  Radiology No results found.    Assessment/Plan Hyperlipidemia lipid control important in reducing the progression of atherosclerotic disease. Continue statin therapy  Essential hypertension blood pressure control important in reducing the progression of atherosclerotic disease. On appropriate oral medications.   Carotid stenosis Carotid duplex today reveals a patent left carotid endarterectomy and stable 60 to 79% right carotid artery stenosis.  Continue current medical regimen.  Recheck in 6 months.    Leotis Pain, MD  01/16/2018 2:09 PM    This note was created with Dragon medical transcription system.  Any errors from dictation are purely unintentional

## 2018-01-22 DIAGNOSIS — J069 Acute upper respiratory infection, unspecified: Secondary | ICD-10-CM | POA: Diagnosis not present

## 2018-01-22 DIAGNOSIS — E782 Mixed hyperlipidemia: Secondary | ICD-10-CM | POA: Diagnosis not present

## 2018-01-22 DIAGNOSIS — H61899 Other specified disorders of external ear, unspecified ear: Secondary | ICD-10-CM | POA: Diagnosis not present

## 2018-01-22 DIAGNOSIS — I6523 Occlusion and stenosis of bilateral carotid arteries: Secondary | ICD-10-CM | POA: Diagnosis not present

## 2018-01-22 DIAGNOSIS — I10 Essential (primary) hypertension: Secondary | ICD-10-CM | POA: Diagnosis not present

## 2018-01-22 DIAGNOSIS — I340 Nonrheumatic mitral (valve) insufficiency: Secondary | ICD-10-CM | POA: Diagnosis not present

## 2018-01-30 DIAGNOSIS — H6980 Other specified disorders of Eustachian tube, unspecified ear: Secondary | ICD-10-CM | POA: Diagnosis not present

## 2018-01-30 DIAGNOSIS — H90A32 Mixed conductive and sensorineural hearing loss, unilateral, left ear with restricted hearing on the contralateral side: Secondary | ICD-10-CM | POA: Diagnosis not present

## 2018-01-30 DIAGNOSIS — H903 Sensorineural hearing loss, bilateral: Secondary | ICD-10-CM | POA: Diagnosis not present

## 2018-01-30 DIAGNOSIS — H6121 Impacted cerumen, right ear: Secondary | ICD-10-CM | POA: Diagnosis not present

## 2018-04-03 DIAGNOSIS — Z0000 Encounter for general adult medical examination without abnormal findings: Secondary | ICD-10-CM | POA: Diagnosis not present

## 2018-04-03 DIAGNOSIS — I10 Essential (primary) hypertension: Secondary | ICD-10-CM | POA: Diagnosis not present

## 2018-04-03 DIAGNOSIS — E782 Mixed hyperlipidemia: Secondary | ICD-10-CM | POA: Diagnosis not present

## 2018-04-29 ENCOUNTER — Emergency Department: Payer: Medicare HMO

## 2018-04-29 ENCOUNTER — Inpatient Hospital Stay
Admission: EM | Admit: 2018-04-29 | Discharge: 2018-05-01 | DRG: 194 | Disposition: A | Discharge status: Home Health | Payer: Medicare HMO | Source: Ambulatory Visit | Attending: Internal Medicine | Admitting: Internal Medicine

## 2018-04-29 ENCOUNTER — Other Ambulatory Visit: Payer: Self-pay

## 2018-04-29 ENCOUNTER — Encounter: Payer: Self-pay | Admitting: Emergency Medicine

## 2018-04-29 DIAGNOSIS — I248 Other forms of acute ischemic heart disease: Secondary | ICD-10-CM | POA: Diagnosis not present

## 2018-04-29 DIAGNOSIS — Z8249 Family history of ischemic heart disease and other diseases of the circulatory system: Secondary | ICD-10-CM

## 2018-04-29 DIAGNOSIS — Z7982 Long term (current) use of aspirin: Secondary | ICD-10-CM | POA: Diagnosis not present

## 2018-04-29 DIAGNOSIS — H9192 Unspecified hearing loss, left ear: Secondary | ICD-10-CM | POA: Diagnosis not present

## 2018-04-29 DIAGNOSIS — I10 Essential (primary) hypertension: Secondary | ICD-10-CM | POA: Diagnosis not present

## 2018-04-29 DIAGNOSIS — Z79891 Long term (current) use of opiate analgesic: Secondary | ICD-10-CM | POA: Diagnosis not present

## 2018-04-29 DIAGNOSIS — R000 Tachycardia, unspecified: Secondary | ICD-10-CM | POA: Diagnosis not present

## 2018-04-29 DIAGNOSIS — R079 Chest pain, unspecified: Secondary | ICD-10-CM | POA: Diagnosis not present

## 2018-04-29 DIAGNOSIS — I2510 Atherosclerotic heart disease of native coronary artery without angina pectoris: Secondary | ICD-10-CM | POA: Diagnosis present

## 2018-04-29 DIAGNOSIS — Z9049 Acquired absence of other specified parts of digestive tract: Secondary | ICD-10-CM

## 2018-04-29 DIAGNOSIS — Z79899 Other long term (current) drug therapy: Secondary | ICD-10-CM

## 2018-04-29 DIAGNOSIS — E785 Hyperlipidemia, unspecified: Secondary | ICD-10-CM | POA: Diagnosis not present

## 2018-04-29 DIAGNOSIS — E872 Acidosis: Secondary | ICD-10-CM | POA: Diagnosis present

## 2018-04-29 DIAGNOSIS — Z7902 Long term (current) use of antithrombotics/antiplatelets: Secondary | ICD-10-CM

## 2018-04-29 DIAGNOSIS — R509 Fever, unspecified: Secondary | ICD-10-CM | POA: Diagnosis not present

## 2018-04-29 DIAGNOSIS — R05 Cough: Secondary | ICD-10-CM | POA: Diagnosis not present

## 2018-04-29 DIAGNOSIS — A419 Sepsis, unspecified organism: Secondary | ICD-10-CM

## 2018-04-29 DIAGNOSIS — N189 Chronic kidney disease, unspecified: Secondary | ICD-10-CM | POA: Diagnosis present

## 2018-04-29 DIAGNOSIS — J181 Lobar pneumonia, unspecified organism: Secondary | ICD-10-CM | POA: Diagnosis not present

## 2018-04-29 DIAGNOSIS — R918 Other nonspecific abnormal finding of lung field: Secondary | ICD-10-CM | POA: Diagnosis not present

## 2018-04-29 DIAGNOSIS — J90 Pleural effusion, not elsewhere classified: Secondary | ICD-10-CM | POA: Diagnosis not present

## 2018-04-29 DIAGNOSIS — Z90710 Acquired absence of both cervix and uterus: Secondary | ICD-10-CM

## 2018-04-29 DIAGNOSIS — J189 Pneumonia, unspecified organism: Secondary | ICD-10-CM | POA: Diagnosis not present

## 2018-04-29 DIAGNOSIS — N179 Acute kidney failure, unspecified: Secondary | ICD-10-CM | POA: Diagnosis not present

## 2018-04-29 DIAGNOSIS — R7989 Other specified abnormal findings of blood chemistry: Secondary | ICD-10-CM | POA: Diagnosis present

## 2018-04-29 DIAGNOSIS — R778 Other specified abnormalities of plasma proteins: Secondary | ICD-10-CM | POA: Diagnosis present

## 2018-04-29 LAB — CBC WITH DIFFERENTIAL/PLATELET
ABS IMMATURE GRANULOCYTES: 0.36 10*3/uL — AB (ref 0.00–0.07)
BASOS PCT: 0 %
Basophils Absolute: 0 10*3/uL (ref 0.0–0.1)
Eosinophils Absolute: 0 10*3/uL (ref 0.0–0.5)
Eosinophils Relative: 0 %
HCT: 37.3 % (ref 36.0–46.0)
HEMOGLOBIN: 11.9 g/dL — AB (ref 12.0–15.0)
Immature Granulocytes: 2 %
Lymphocytes Relative: 3 %
Lymphs Abs: 0.7 10*3/uL (ref 0.7–4.0)
MCH: 30.8 pg (ref 26.0–34.0)
MCHC: 31.9 g/dL (ref 30.0–36.0)
MCV: 96.6 fL (ref 80.0–100.0)
MONO ABS: 1.6 10*3/uL — AB (ref 0.1–1.0)
Monocytes Relative: 7 %
NEUTROS ABS: 19.1 10*3/uL — AB (ref 1.7–7.7)
NRBC: 0 % (ref 0.0–0.2)
Neutrophils Relative %: 88 %
PLATELETS: 290 10*3/uL (ref 150–400)
RBC: 3.86 MIL/uL — ABNORMAL LOW (ref 3.87–5.11)
RDW: 12.5 % (ref 11.5–15.5)
WBC: 21.8 10*3/uL — AB (ref 4.0–10.5)

## 2018-04-29 LAB — URINALYSIS, COMPLETE (UACMP) WITH MICROSCOPIC
Bilirubin Urine: NEGATIVE
GLUCOSE, UA: NEGATIVE mg/dL
Ketones, ur: 5 mg/dL — AB
NITRITE: NEGATIVE
Protein, ur: 30 mg/dL — AB
SPECIFIC GRAVITY, URINE: 1.014 (ref 1.005–1.030)
pH: 5 (ref 5.0–8.0)

## 2018-04-29 LAB — INFLUENZA PANEL BY PCR (TYPE A & B)
Influenza A By PCR: NEGATIVE
Influenza B By PCR: NEGATIVE

## 2018-04-29 LAB — COMPREHENSIVE METABOLIC PANEL
ALT: 18 U/L (ref 0–44)
ANION GAP: 13 (ref 5–15)
AST: 22 U/L (ref 15–41)
Albumin: 3.7 g/dL (ref 3.5–5.0)
Alkaline Phosphatase: 86 U/L (ref 38–126)
BUN: 34 mg/dL — ABNORMAL HIGH (ref 8–23)
CHLORIDE: 104 mmol/L (ref 98–111)
CO2: 21 mmol/L — AB (ref 22–32)
CREATININE: 1.62 mg/dL — AB (ref 0.44–1.00)
Calcium: 9.1 mg/dL (ref 8.9–10.3)
GFR, EST AFRICAN AMERICAN: 33 mL/min — AB (ref >60)
GFR, EST NON AFRICAN AMERICAN: 29 mL/min — AB (ref >60)
Glucose, Bld: 112 mg/dL — ABNORMAL HIGH (ref 70–99)
Potassium: 3.5 mmol/L (ref 3.5–5.1)
SODIUM: 138 mmol/L (ref 135–145)
Total Bilirubin: 1.5 mg/dL — ABNORMAL HIGH (ref 0.3–1.2)
Total Protein: 7.1 g/dL (ref 6.5–8.1)

## 2018-04-29 LAB — LACTIC ACID, PLASMA: LACTIC ACID, VENOUS: 0.9 mmol/L (ref 0.5–1.9)

## 2018-04-29 LAB — PROTIME-INR
INR: 1.11
Prothrombin Time: 14.2 seconds (ref 11.4–15.2)

## 2018-04-29 LAB — TROPONIN I: Troponin I: 0.03 ng/mL (ref <0.03)

## 2018-04-29 MED ORDER — ACETAMINOPHEN 650 MG RE SUPP: 650.0000 mg | Freq: Four times a day (QID) | RECTAL | Status: DC | PRN

## 2018-04-29 MED ORDER — ASPIRIN 81 MG PO CHEW
81.0000 mg | CHEWABLE_TABLET | ORAL | Status: DC
  Administered 2018-04-30 – 2018-05-01 (×2): 81 mg via ORAL
  Filled 2018-04-29 (×2): qty 1

## 2018-04-29 MED ORDER — SODIUM CHLORIDE 0.9 % IV SOLN
2.0000 g | INTRAVENOUS | Status: DC
  Administered 2018-04-29: 2 g via INTRAVENOUS
  Filled 2018-04-29: qty 20

## 2018-04-29 MED ORDER — CLOPIDOGREL BISULFATE 75 MG PO TABS
75.0000 mg | ORAL_TABLET | Freq: Every day | ORAL | Status: DC
  Administered 2018-04-30 – 2018-05-01 (×2): 75 mg via ORAL
  Filled 2018-04-29 (×2): qty 1

## 2018-04-29 MED ORDER — OXYCODONE-ACETAMINOPHEN 5-325 MG PO TABS: 1.0000 | ORAL_TABLET | Freq: Three times a day (TID) | ORAL | Status: DC | PRN

## 2018-04-29 MED ORDER — POLYETHYLENE GLYCOL 3350 17 G PO PACK
17.0000 g | PACK | Freq: Every day | ORAL | Status: DC | PRN
  Administered 2018-04-30: 09:00:00 17 g via ORAL
  Filled 2018-04-29: qty 1

## 2018-04-29 MED ORDER — BENZONATATE 100 MG PO CAPS: 100.0000 mg | ORAL_CAPSULE | Freq: Three times a day (TID) | ORAL | Status: DC | PRN

## 2018-04-29 MED ORDER — LISINOPRIL 20 MG PO TABS
20.0000 mg | ORAL_TABLET | Freq: Every day | ORAL | Status: DC
  Filled 2018-04-29: qty 1

## 2018-04-29 MED ORDER — HEPARIN SODIUM (PORCINE) 5000 UNIT/ML IJ SOLN
5000.0000 [IU] | Freq: Three times a day (TID) | INTRAMUSCULAR | Status: DC
  Administered 2018-04-30 – 2018-05-01 (×4): 5000 [IU] via SUBCUTANEOUS
  Filled 2018-04-29 (×4): qty 1

## 2018-04-29 MED ORDER — SODIUM CHLORIDE 0.9 % IV SOLN
500.0000 mg | INTRAVENOUS | Status: DC
  Administered 2018-04-29: 500 mg via INTRAVENOUS
  Filled 2018-04-29 (×2): qty 500

## 2018-04-29 MED ORDER — IPRATROPIUM-ALBUTEROL 0.5-2.5 (3) MG/3ML IN SOLN: 3.0000 mL | RESPIRATORY_TRACT | Status: DC | PRN

## 2018-04-29 MED ORDER — ATORVASTATIN CALCIUM 20 MG PO TABS
10.0000 mg | ORAL_TABLET | Freq: Every day | ORAL | Status: DC
  Administered 2018-04-30: 10 mg via ORAL
  Filled 2018-04-29: qty 1

## 2018-04-29 MED ORDER — AMLODIPINE BESYLATE 10 MG PO TABS
10.0000 mg | ORAL_TABLET | Freq: Every day | ORAL | Status: DC
  Administered 2018-04-30 – 2018-05-01 (×2): 10 mg via ORAL
  Filled 2018-04-29 (×2): qty 1

## 2018-04-29 MED ORDER — ACETAMINOPHEN 500 MG PO TABS
1000.0000 mg | ORAL_TABLET | Freq: Once | ORAL | Status: AC
  Administered 2018-04-29: 1000 mg via ORAL
  Filled 2018-04-29: qty 2

## 2018-04-29 MED ORDER — NITROGLYCERIN 0.4 MG SL SUBL: 0.4000 mg | SUBLINGUAL_TABLET | SUBLINGUAL | Status: DC | PRN

## 2018-04-29 MED ORDER — SODIUM CHLORIDE 0.9 % IV SOLN
1.0000 g | INTRAVENOUS | Status: DC
  Administered 2018-04-30: 19:00:00 1 g via INTRAVENOUS
  Filled 2018-04-29: qty 1
  Filled 2018-04-29: qty 10

## 2018-04-29 MED ORDER — ACETAMINOPHEN 325 MG PO TABS
650.0000 mg | ORAL_TABLET | Freq: Four times a day (QID) | ORAL | Status: DC | PRN
  Administered 2018-04-30: 650 mg via ORAL
  Filled 2018-04-29: qty 2

## 2018-04-29 MED ORDER — SODIUM CHLORIDE 0.9 % IV BOLUS
1000.0000 mL | Freq: Once | INTRAVENOUS | Status: AC
  Administered 2018-04-29: 1000 mL via INTRAVENOUS

## 2018-04-29 NOTE — ED Notes (Signed)
Pt taken to xray 

## 2018-04-29 NOTE — Progress Notes (Signed)
CODE SEPSIS - PHARMACY COMMUNICATION  **Broad Spectrum Antibiotics should be administered within 1 hour of Sepsis diagnosis**  Time Code Sepsis Called/Page Received: 1/26 @ 1731  Antibiotics Ordered: Rocephin 2 gm   Time of 1st antibiotic administration: 1/26 @ 1740  Additional action taken by pharmacy: none   If necessary, Name of Provider/Nurse Contacted:     Brytni Dray D ,PharmD Clinical Pharmacist  04/29/2018  5:45 PM

## 2018-04-29 NOTE — ED Notes (Signed)
Pt returned from xray

## 2018-04-29 NOTE — ED Triage Notes (Signed)
PT states she had a cough and chest pain when she coughed which caused her to got to [place] [facility]. [facility] diagnosed her with pneumonia on the left side, elevated WBC (19.6), and an increase of her creatinine. Pt was instructed to come to the ED for further evaluation. Pt currently denies pain.

## 2018-04-29 NOTE — ED Notes (Signed)
MD Paduchowski made aware of pt's condition and gave an additions order for flu nasal swab in addition to suspected infection protocol.

## 2018-04-29 NOTE — ED Provider Notes (Signed)
[facility] Emergency Department Provider Note  Time seen: 5:25 PM  I have reviewed the triage vital signs and the nursing notes.   HISTORY  Chief Complaint Abnormal Lab    HPI [name] is a 85 y.o. female with a past medical history of CAD, hypertension, hyperlipidemia, presents to the emergency department for chest pain, cough, sent by her PCP for evaluation.  According to the patient since Tuesday she has been coughing with occasional chest pains especially in the right side of her chest.  Denies any known fever, denies any nausea vomiting or diarrhea.  Patient went to the walk-in clinic today for evaluation was diagnosed with a possible pneumonia, had elevated white blood cell count of 19,000 was referred to the emergency department for further evaluation.  Upon arrival patient noted to be tachycardic around 120 bpm, afebrile but mildly tachypneic with a room air saturation of 91%.  Patient is asking when she can be discharged home as she has been here all day, per patient.   Past Medical History:  Diagnosis Date  . Carotid artery stenosis    bilateral  . 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  . Hypercholesteremia   . Hypertension     Patient Active Problem List   Diagnosis Date Noted  . Essential hypertension 07/26/2017  . Hyperlipidemia 07/26/2017  . Carotid stenosis 12/17/2014    Past Surgical History:  Procedure Laterality Date  . ABDOMINAL HYSTERECTOMY    . CHOLECYSTECTOMY    . COLON SURGERY    . COLONOSCOPY WITH PROPOFOL N/A 02/23/2016   Procedure: COLONOSCOPY WITH PROPOFOL;  Surgeon: Lollie Sails, MD;  Location: [facility] ENDOSCOPY;  Service: Endoscopy;  Laterality: N/A;  . ENDARTERECTOMY Left 12/17/2014   Procedure: ENDARTERECTOMY CAROTID;  Surgeon: Algernon Huxley, MD;  Location: [facility] ORS;  Service: Vascular;  Laterality: Left;  . INNER EAR SURGERY Left    Removal of ear tumor.    Prior to  Admission medications   Medication Sig Start Date End Date Taking? Authorizing Provider  amLODipine (NORVASC) 10 MG tablet Take 10 mg by mouth every morning.    [provider]  aspirin 81 MG tablet Take 81 mg by mouth every morning.    [provider]  atorvastatin (LIPITOR) 10 MG tablet  05/08/17   [provider]  Calcium Carbonate-Vitamin D 600-400 MG-UNIT tablet Take by mouth.    [provider]  clopidogrel (PLAVIX) 75 MG tablet Take 1 tablet (75 mg total) by mouth daily. 12/18/14   Stegmayer, Joelene Millin A, PA-C  ergocalciferol (VITAMIN D2) 50000 UNITS capsule Take 50,000 Units by mouth once a week.    [provider]  hydrochlorothiazide (HYDRODIURIL) 12.5 MG tablet  12/21/17   [provider]  lisinopril (PRINIVIL,ZESTRIL) 20 MG tablet Take 20 mg by mouth at bedtime.    [provider]  loperamide (IMODIUM A-D) 2 MG tablet Take 1 tablet (2 mg total) by mouth 4 (four) times daily as needed for diarrhea or loose stools. 09/01/16   Earleen Newport, MD  Multiple Vitamins-Minerals (ICAPS) CAPS Take 1 capsule by mouth 2 (two) times daily.    [provider]  nitroGLYCERIN (NITROSTAT) 0.4 MG SL tablet Place 0.4 mg under the tongue every 5 (five) minutes as needed for chest pain.    [provider]  omeprazole (PRILOSEC) 20 MG capsule Take 20 mg by mouth 2 (two) times daily before a  meal.    [provider]  ondansetron (ZOFRAN ODT) 4 MG disintegrating tablet Take 1 tablet (4 mg total) by mouth every 8 (eight) hours as needed for nausea or vomiting. 09/01/16   Earleen Newport, MD  oxyCODONE-acetaminophen (PERCOCET/ROXICET) 5-325 MG per tablet Take 1 tablet by mouth every 6 (six) hours as needed (Pain). 12/18/14   Stegmayer, Janalyn Harder, PA-C  pantoprazole (PROTONIX) 20 MG tablet  01/09/18   [provider]  potassium chloride (K-DUR) 10 MEQ tablet Take 10 mEq by mouth every morning.    [provider]    No Known Allergies  No family history on file.  Social History Social History   Tobacco Use  . Smoking status: Never Smoker  . Smokeless tobacco: Never Used  Substance Use Topics  . Alcohol use: No  . Drug use: No    Review of Systems Constitutional: Negative for fever. ENT: Mild congestion/sneezing Cardiovascular: Occasional right-sided chest pains Respiratory: Negative for shortness of breath.  Positive for cough. Gastrointestinal: Negative for abdominal pain, vomiting  Musculoskeletal: Negative for musculoskeletal complaints Skin: Negative for skin complaints  Neurological: Negative for headache All other ROS negative  ____________________________________________   PHYSICAL EXAM:  VITAL SIGNS: ED Triage Vitals  Enc Vitals Group     BP 04/29/18 1632 (!) 155/53     Pulse Rate 04/29/18 1632 (!) 116     Resp 04/29/18 1632 (!) 26     Temp 04/29/18 1632 99 F (37.2 C)     Temp Source 04/29/18 1632 Oral     SpO2 04/29/18 1632 93 %     Weight 04/29/18 1633 106 lb (48.1 kg)     Height 04/29/18 1633 5' (1.524 m)     Head Circumference --      Peak Flow --      Pain Score 04/29/18 1633 0     Pain Loc --      Pain Edu? --      Excl. in [place]? --    Constitutional: Alert and oriented. Well appearing and in no distress. Eyes: Normal exam ENT   Head: Normocephalic and atraumatic.   Mouth/Throat: Mucous membranes are moist. Cardiovascular: Normal rate, regular rhythm Respiratory: Normal respiratory effort without tachypnea nor retractions. Breath sounds are clear  Gastrointestinal: Soft and nontender. No distention.  Musculoskeletal: Nontender with normal range of motion in all extremities.  Neurologic:  Normal speech and language. No gross focal neurologic deficits Skin:  Skin is warm, dry and intact.  Psychiatric: Mood and affect are normal.   ____________________________________________    EKG  EKG viewed and interpreted by myself  shows sinus tachycardia 114 bpm with a narrow QRS, normal axis, largely normal intervals with nonspecific ST changes.  No ST elevation.  ____________________________________________    RADIOLOGY  Chest x-ray consistent multifocal pneumonia.  ____________________________________________   INITIAL IMPRESSION / ASSESSMENT AND PLAN / ED COURSE  Pertinent labs & imaging results that were available during my care of the patient were reviewed by me and considered in my medical decision making (see chart for details).  Patient presents to the emergency department for possible pneumonia referred from the walk-in clinic for further evaluation.  Patient states approximately 5 days of cough and occasional right-sided chest pain.  Patient has a room air saturation currently of 91 to 93% currently tachycardic between 110 and 120 bpm.  We will check labs, cultures and continue to closely monitor.  Unfortunately we are not able to see the patient's chest  x-ray performed earlier today we will repeat a chest x-ray, and start with antibiotics to cover community-acquired pneumonia.  Patient is fairly adamant that she be discharged home but is willing to stay for the ER work-up, have already discussed with the patient the severity of her illness in my desire to admit her to the hospital.  Chest x-ray most consistent with multifocal pneumonia.  Patient's labs are resulted with an elevated white blood cell count of 21,000, pulse remains elevated around 115 bpm.  Remains tachypneic with a room air saturation of 90%.  Given these findings patient will be admitted to the hospitalist service for continued IV antibiotics.  Patient reluctantly agreeable to this plan of care.  Family agreeable.   CRITICAL CARE Performed by: Harvest Dark   Total critical care time: 30 minutes  Critical care time was exclusive of separately billable procedures and treating other patients.  Critical care was necessary to treat or  prevent imminent or life-threatening deterioration.  Critical care was time spent personally by me on the following activities: development of treatment plan with patient and/or surrogate as well as nursing, discussions with consultants, evaluation of patient's response to treatment, examination of patient, obtaining history from patient or surrogate, ordering and performing treatments and interventions, ordering and review of laboratory studies, ordering and review of radiographic studies, pulse oximetry and re-evaluation of patient's condition.   ____________________________________________   FINAL CLINICAL IMPRESSION(S) / ED DIAGNOSES  Community-acquired pneumonia Sepsis   Harvest Dark, MD 05/03/18 2205

## 2018-04-29 NOTE — H&P (Signed)
PCP:   Dion Body, MD   Code staus: full code  Chief Complaint:  Nausea, vomiting  HPI: This is a 85 year old female who was sent in from the walk in [place] clinic.  Per family, on Tuesday the patient stated she started feeling badly, she had some cold symptoms of a nonproductive cough, sniffles. On Thursday she developed nausea and vomiting. Yesterday the patient stated she felt better. Today she told her granddaughters that she wanted to go to the doctor. At the clinic she admitted her chest was hurting her with her nausea and vomiting. She  has been weaker. There is no report of confusion. She has been coughing up phlegm. There is no report of fevers or chills.  Work-up done at the clinic was suspicious for sepsis.  She was sent to the ER.  I am unable to see any of the reports from the clinic.  Chest x-ray done here reveals pneumonia.  History provided by the patient who is poor to fair historian.  She is alert and oriented   Review of Systems:  The patient denies anorexia, fever, weight loss,, vision loss, decreased hearing, hoarseness, chest pain, syncope, dyspnea on exertion, nausea, vomiting, peripheral edema, balance deficits, hemoptysis, abdominal pain, melena, hematochezia, severe indigestion/heartburn, hematuria, incontinence, genital sores, muscle weakness, suspicious skin lesions, transient blindness, difficulty walking, depression, unusual weight change, abnormal bleeding, enlarged lymph nodes, angioedema, and breast masses.  Past Medical History: Past Medical History:  Diagnosis Date  . Carotid artery stenosis    bilateral  . 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  . Hypercholesteremia   . Hypertension    Past Surgical History:  Procedure Laterality Date  . ABDOMINAL HYSTERECTOMY    . CHOLECYSTECTOMY    . COLON SURGERY    . COLONOSCOPY WITH PROPOFOL N/A 02/23/2016   Procedure: COLONOSCOPY WITH PROPOFOL;  Surgeon: Lollie Sails, MD;  Location: [facility] ENDOSCOPY;  Service: Endoscopy;  Laterality: N/A;  . ENDARTERECTOMY Left 12/17/2014   Procedure: ENDARTERECTOMY CAROTID;  Surgeon: Algernon Huxley, MD;  Location: [facility] ORS;  Service: Vascular;  Laterality: Left;  . INNER EAR SURGERY Left    Removal of ear tumor.    Medications: Prior to Admission medications   Medication Sig Start Date End Date Taking? Authorizing Provider  amLODipine (NORVASC) 10 MG tablet Take 10 mg by mouth every morning.    [provider]  aspirin 81 MG tablet Take 81 mg by mouth every morning.    [provider]  atorvastatin (LIPITOR) 10 MG tablet  05/08/17   [provider]  Calcium Carbonate-Vitamin D 600-400 MG-UNIT tablet Take by mouth.    [provider]  clopidogrel (PLAVIX) 75 MG tablet Take 1 tablet (75 mg total) by mouth daily. 12/18/14   Stegmayer, Joelene Millin A, PA-C  ergocalciferol (VITAMIN D2) 50000 UNITS capsule Take 50,000 Units by mouth once a week.    [provider]  hydrochlorothiazide (HYDRODIURIL) 12.5 MG tablet  12/21/17   [provider]  lisinopril (PRINIVIL,ZESTRIL) 20 MG tablet Take 20 mg by mouth at bedtime.    [provider]  loperamide (IMODIUM A-D) 2 MG tablet Take 1 tablet (2 mg total) by mouth 4 (four) times daily as needed for diarrhea or loose stools. 09/01/16   Earleen Newport, MD  Multiple Vitamins-Minerals (ICAPS) CAPS Take 1 capsule by mouth 2 (two) times daily.    [provider]  nitroGLYCERIN (NITROSTAT) 0.4  MG SL tablet Place 0.4 mg under the tongue every 5 (five) minutes as needed for chest pain.    [provider]  omeprazole (PRILOSEC) 20 MG capsule Take 20 mg by mouth 2 (two) times daily before a meal.    [provider]  ondansetron (ZOFRAN ODT) 4 MG disintegrating tablet Take 1 tablet (4 mg total) by mouth every 8 (eight) hours as needed for nausea or vomiting. 09/01/16   Earleen Newport, MD   oxyCODONE-acetaminophen (PERCOCET/ROXICET) 5-325 MG per tablet Take 1 tablet by mouth every 6 (six) hours as needed (Pain). 12/18/14   Stegmayer, Janalyn Harder, PA-C  pantoprazole (PROTONIX) 20 MG tablet  01/09/18   [provider]  potassium chloride (K-DUR) 10 MEQ tablet Take 10 mEq by mouth every morning.    [provider]    Allergies:  No Known Allergies  Social History:  reports that she has never smoked. She has never used smokeless tobacco. She reports that she does not drink alcohol or use drugs.  Family History: Hypertension  Physical Exam: Vitals:   04/29/18 1633 04/29/18 1717 04/29/18 1730 04/29/18 1830  BP:  139/70 138/64 (!) 153/61  Pulse:  (!) 120 (!) 111 (!) 114  Resp:  (!) 23 (!) 25 (!) 26  Temp:      TempSrc:      SpO2:  91% 90% 91%  Weight: 48.1 kg     Height: 5' (1.524 m)       General:  Alert and oriented times three, well developed and nourished, no acute distress, weak Eyes: PERRLA, pink conjunctiva, no scleral icterus ENT: Moist oral mucosa, neck supple, no thyromegaly Lungs: clear to ascultation, no wheeze, no crackles, no use of accessory muscles Cardiovascular: regular rate and rhythm, no regurgitation, no gallops, no murmurs. No carotid bruits, no JVD Abdomen: soft, positive BS, non-tender, non-distended, no organomegaly, not an acute abdomen GU: not examined Neuro: CN II - XII grossly intact, sensation intact Musculoskeletal: strength 5/5 all extremities, no clubbing, cyanosis or edema Skin: no rash, no subcutaneous crepitation, no decubitus Psych: appropriate patient   Labs on Admission:  Recent Labs    04/29/18 1649  NA 138  K 3.5  CL 104  CO2 21*  GLUCOSE 112*  BUN 34*  CREATININE 1.62*  CALCIUM 9.1   Recent Labs    04/29/18 1649  AST 22  ALT 18  ALKPHOS 86  BILITOT 1.5*  PROT 7.1  ALBUMIN 3.7   No results for input(s): LIPASE, AMYLASE in the last 72 hours. Recent Labs    04/29/18 1649  WBC 21.8*   NEUTROABS 19.1*  HGB 11.9*  HCT 37.3  MCV 96.6  PLT 290   Recent Labs    04/29/18 1649  TROPONINI 0.03*   Invalid input(s): POCBNP No results for input(s): DDIMER in the last 72 hours. No results for input(s): HGBA1C in the last 72 hours. No results for input(s): CHOL, HDL, LDLCALC, TRIG, CHOLHDL, LDLDIRECT in the last 72 hours. No results for input(s): TSH, T4TOTAL, T3FREE, THYROIDAB in the last 72 hours.  Invalid input(s): FREET3 No results for input(s): VITAMINB12, FOLATE, FERRITIN, TIBC, IRON, RETICCTPCT in the last 72 hours.  Micro Results: No results found for this or any previous visit (from the past 240 hour(s)).   Radiological Exams on Admission: Dg Chest 2 View  Result Date: 04/29/2018 CLINICAL DATA:  Cough and chest pain. EXAM: CHEST - 2 VIEW COMPARISON:  None. FINDINGS: Infiltrate centered in the lingula. Focal rounded  opacity in the right mid lung laterally. No other acute abnormalities. IMPRESSION: 1. Infiltrate in the lingula. Recommend follow-up chest x-ray to ensure resolution. This is suspicious for pneumonia. 2. Focal opacity in the lateral right lung may represent another small region of infiltrate. Recommend attention on follow-up. Electronically Signed   By: Dorise Bullion III M.D   On: 04/29/2018 18:21    Assessment/Plan Present on Admission: . Community acquired pneumonia/lactic acidosis -Admit to MedSurg with telemetry -Blood and urine cultures collected -Repeat lactic acid level in 4 hours and in a.m.  Follow to normalize -IV antibiotics Rocephin and azithromycin ordered -Duonebs, oxygen to keep sats greater than 88% -Influenza negative  . AKI (acute kidney injury) ([place]) -Patient has received 3 L IV fluid in the ER.  We will continue gentle IV fluid hydration.  Strict I's and O's  . Elevated troponin -Doubt due to ischemia.  Likely secondary to pneumonia and acute kidney injury.  Will order troponin for the a.m.  . Essential  hypertension -Stable, home meds resumed  . Hyperlipidemia -Stable, home meds resumed  Gage Weant 04/29/2018, 6:53 PM

## 2018-04-29 NOTE — ED Notes (Signed)
Pt states she is ready to go home, states she doesn't want to be here. Family confirms pt was at [facility] and sent to ED d/t L side pneumonia. Pt denies CP or SOB. Pt states she "just went for a check up" A&O. No distress noted. Pt able to ambulate from wheelchair to stretcher. Pt feels warm to touch but was under a blanket.

## 2018-04-29 NOTE — ED Notes (Signed)
Pt transported to room 103 

## 2018-04-30 ENCOUNTER — Inpatient Hospital Stay: Payer: Medicare HMO

## 2018-04-30 LAB — CBC
HCT: 32 % — ABNORMAL LOW (ref 36.0–46.0)
Hemoglobin: 10.1 g/dL — ABNORMAL LOW (ref 12.0–15.0)
MCH: 30.5 pg (ref 26.0–34.0)
MCHC: 31.6 g/dL (ref 30.0–36.0)
MCV: 96.7 fL (ref 80.0–100.0)
Platelets: 204 10*3/uL (ref 150–400)
RBC: 3.31 MIL/uL — ABNORMAL LOW (ref 3.87–5.11)
RDW: 12.4 % (ref 11.5–15.5)
WBC: 14.2 10*3/uL — ABNORMAL HIGH (ref 4.0–10.5)
nRBC: 0 % (ref 0.0–0.2)

## 2018-04-30 LAB — BASIC METABOLIC PANEL
Anion gap: 11 (ref 5–15)
BUN: 34 mg/dL — ABNORMAL HIGH (ref 8–23)
CO2: 22 mmol/L (ref 22–32)
CREATININE: 1.46 mg/dL — AB (ref 0.44–1.00)
Calcium: 8.5 mg/dL — ABNORMAL LOW (ref 8.9–10.3)
Chloride: 107 mmol/L (ref 98–111)
GFR calc Af Amer: 38 mL/min — ABNORMAL LOW (ref >60)
GFR calc non Af Amer: 33 mL/min — ABNORMAL LOW (ref >60)
Glucose, Bld: 94 mg/dL (ref 70–99)
Potassium: 3.3 mmol/L — ABNORMAL LOW (ref 3.5–5.1)
Sodium: 140 mmol/L (ref 135–145)

## 2018-04-30 MED ORDER — AZITHROMYCIN 500 MG PO TABS
500.0000 mg | ORAL_TABLET | Freq: Every day | ORAL | Status: DC
  Administered 2018-04-30: 500 mg via ORAL
  Filled 2018-04-30: qty 1

## 2018-04-30 MED ORDER — POTASSIUM CHLORIDE CRYS ER 20 MEQ PO TBCR
20.0000 meq | EXTENDED_RELEASE_TABLET | Freq: Once | ORAL | Status: AC
  Administered 2018-04-30: 14:00:00 20 meq via ORAL
  Filled 2018-04-30: qty 1

## 2018-04-30 MED ORDER — SENNOSIDES-DOCUSATE SODIUM 8.6-50 MG PO TABS
1.0000 | ORAL_TABLET | Freq: Two times a day (BID) | ORAL | Status: DC
  Administered 2018-04-30 – 2018-05-01 (×2): 1 via ORAL
  Filled 2018-04-30 (×2): qty 1

## 2018-04-30 NOTE — Progress Notes (Signed)
Pharmacy Electrolyte Monitoring Consult:  Pharmacy consulted to assist in monitoring and replacing electrolytes in this 85 y.o. female admitted on 04/29/2018 with CAP.  Labs:  Sodium (mmol/Barajas)  Date Value  04/30/2018 140  09/26/2012 144   Potassium (mmol/Barajas)  Date Value  04/30/2018 3.3 (Barajas)  09/26/2012 3.4 (Barajas)   Calcium (mg/dL)  Date Value  04/30/2018 8.5 (Barajas)   Calcium, Total (mg/dL)  Date Value  09/26/2012 9.1   Albumin (g/dL)  Date Value  04/29/2018 3.7    Assessment/Plan: Patient with AKI. Will order potassium 72mEq PO x 1. BMP scheduled with am labs.   Will replace for goal electrolytes WNL.   Pharmacy will continue to monitor and adjust per consult.   Sharon Barajas,Sharon Barajas 04/30/2018 11:27 AM

## 2018-04-30 NOTE — Progress Notes (Signed)
[place] [facility] [place] NAME: Sharon Barajas    MR#:  [id number]  DATE OF BIRTH:  [birth year]  SUBJECTIVE:  CHIEF COMPLAINT:   Chief Complaint  Patient presents with  . Abnormal Lab  no complaints, wants to go home. Wants to eat. Some cough + REVIEW OF SYSTEMS:  Review of Systems  Constitutional: Positive for weight loss. Negative for chills and fever.  HENT: Negative for nosebleeds and sore throat.   Eyes: Negative for blurred vision.  Respiratory: Positive for cough and shortness of breath. Negative for wheezing.   Cardiovascular: Negative for chest pain, orthopnea, leg swelling and PND.  Gastrointestinal: Negative for abdominal pain, constipation, diarrhea, heartburn, nausea and vomiting.  Genitourinary: Negative for dysuria and urgency.  Musculoskeletal: Negative for back pain.  Skin: Negative for rash.  Neurological: Negative for dizziness, speech change, focal weakness and headaches.  Endo/Heme/Allergies: Does not bruise/bleed easily.  Psychiatric/Behavioral: Negative for depression.   DRUG ALLERGIES:  No Known Allergies VITALS:  Blood pressure 123/63, pulse 92, temperature 97.9 F (36.6 C), temperature source Oral, resp. rate (!) 21, height 5' (1.524 m), weight 48.1 kg, SpO2 96 %. PHYSICAL EXAMINATION:  Physical Exam Constitutional:      Appearance: She is underweight.  HENT:     Head: Normocephalic and atraumatic.  Eyes:     Conjunctiva/sclera: Conjunctivae normal.     Pupils: Pupils are equal, round, and reactive to light.  Neck:     Musculoskeletal: Normal range of motion and neck supple.     Thyroid: No thyromegaly.     Trachea: No tracheal deviation.  Cardiovascular:     Rate and Rhythm: Normal rate and regular rhythm.     Heart sounds: Normal heart sounds.  Pulmonary:     Effort: Pulmonary effort is normal. No respiratory distress.     Breath sounds: Normal breath sounds. No wheezing.  Chest:     Chest wall:  No tenderness.  Abdominal:     General: Bowel sounds are normal. There is no distension.     Palpations: Abdomen is soft.     Tenderness: There is no abdominal tenderness.  Musculoskeletal: Normal range of motion.  Skin:    General: Skin is warm and dry.     Findings: No rash.  Neurological:     Mental Status: She is alert and oriented to person, place, and time.     Cranial Nerves: No cranial nerve deficit.    LABORATORY PANEL:  Female CBC Recent Labs  Lab 04/29/18 1649  WBC 21.8*  HGB 11.9*  HCT 37.3  PLT 290   ------------------------------------------------------------------------------------------------------------------ Chemistries  Recent Labs  Lab 04/29/18 1649  NA 138  K 3.5  CL 104  CO2 21*  GLUCOSE 112*  BUN 34*  CREATININE 1.62*  CALCIUM 9.1  AST 22  ALT 18  ALKPHOS 86  BILITOT 1.5*   RADIOLOGY:  Dg Chest 2 View  Result Date: 04/29/2018 CLINICAL DATA:  Cough and chest pain. EXAM: CHEST - 2 VIEW COMPARISON:  None. FINDINGS: Infiltrate centered in the lingula. Focal rounded opacity in the right mid lung laterally. No other acute abnormalities. IMPRESSION: 1. Infiltrate in the lingula. Recommend follow-up chest x-ray to ensure resolution. This is suspicious for pneumonia. 2. Focal opacity in the lateral right lung may represent another small region of infiltrate. Recommend attention on follow-up. Electronically Signed   By: Dorise Bullion III M.D   On: 04/29/2018 18:21   ASSESSMENT AND PLAN:  5 y f with cough, sob  . Community acquired pneumonia/lactic acidosis - no need of tele as heart rhythm is ok -Blood and urine cultures pending.  -Repeat lactic acid level normalized -IV antibiotics Rocephin and azithromycin  -Duonebs, oxygen to keep sats greater than 88% -Influenza negative - get CT chest as she reports some unintentional weight loss  . AKI (acute kidney injury) ([place]) -Patient has received 3 L IV fluid in the ER. continue gentle IV fluid  hydration.  Strict I's and O's - recheck Labs now and in am  . Elevated troponin -Doubt due to ischemia.  Likely secondary to pneumonia and acute kidney injury, neg serial troponins  . Essential hypertension -Stable, home meds resumed, hold lisinopril due to AKI  . Hyperlipidemia -Stable, home meds resumed   D/C tele, ambulate, wean O2  All the records are reviewed and case discussed with Care Management/Social Worker. Management plans discussed with the patient, family (talked with [place] - grand daughter) and they are in agreement.  CODE STATUS: Full Code  TOTAL TIME TAKING CARE OF THIS PATIENT: 35 minutes.   More than 50% of the time was spent in counseling/coordination of care: YES  POSSIBLE D/C IN 1 DAYS, DEPENDING ON CLINICAL CONDITION.   Max Sane M.D on 04/30/2018 at 7:41 AM  Between 7am to 6pm - Pager - [phone]  After 6pm go to [email or web address] - Proofreader  [facility]  Office  [phone]  CC: Primary care physician; Dion Body, MD  Note: This dictation was prepared with Dragon dictation along with smaller phrase technology. Any transcriptional errors that result from this process are unintentional.

## 2018-05-01 LAB — URINE CULTURE

## 2018-05-01 LAB — CBC
HCT: 47.9 % — ABNORMAL HIGH (ref 36.0–46.0)
Hemoglobin: 14.9 g/dL (ref 12.0–15.0)
MCH: 30.1 pg (ref 26.0–34.0)
MCHC: 31.1 g/dL (ref 30.0–36.0)
MCV: 96.8 fL (ref 80.0–100.0)
Platelets: 165 10*3/uL (ref 150–400)
RBC: 4.95 MIL/uL (ref 3.87–5.11)
RDW: 12.4 % (ref 11.5–15.5)
WBC: 6.6 10*3/uL (ref 4.0–10.5)
nRBC: 0 % (ref 0.0–0.2)

## 2018-05-01 LAB — BASIC METABOLIC PANEL
Anion gap: 8 (ref 5–15)
BUN: 33 mg/dL — ABNORMAL HIGH (ref 8–23)
CO2: 23 mmol/L (ref 22–32)
Calcium: 8.8 mg/dL — ABNORMAL LOW (ref 8.9–10.3)
Chloride: 109 mmol/L (ref 98–111)
Creatinine, Ser: 1.51 mg/dL — ABNORMAL HIGH (ref 0.44–1.00)
GFR calc Af Amer: 36 mL/min — ABNORMAL LOW (ref >60)
GFR calc non Af Amer: 31 mL/min — ABNORMAL LOW (ref >60)
Glucose, Bld: 99 mg/dL (ref 70–99)
Potassium: 3.5 mmol/L (ref 3.5–5.1)
SODIUM: 140 mmol/L (ref 135–145)

## 2018-05-01 LAB — PROCALCITONIN: Procalcitonin: 0.67 ng/mL

## 2018-05-01 MED ORDER — AMOXICILLIN-POT CLAVULANATE 875-125 MG PO TABS: 1.0000 | ORAL_TABLET | Freq: Two times a day (BID) | ORAL | 0 refills | Status: AC

## 2018-05-01 NOTE — Progress Notes (Signed)
Pharmacy Electrolyte Monitoring Consult:  Pharmacy consulted to assist in monitoring and replacing electrolytes in this 85 y.o. female admitted on 04/29/2018 with CAP.  Labs:  Sodium (mmol/L)  Date Value  05/01/2018 140  09/26/2012 144   Potassium (mmol/L)  Date Value  05/01/2018 3.5  09/26/2012 3.4 (L)   Calcium (mg/dL)  Date Value  05/01/2018 8.8 (L)   Calcium, Total (mg/dL)  Date Value  09/26/2012 9.1   Albumin (g/dL)  Date Value  04/29/2018 3.7    Assessment/Plan: Electrolytes WNL. No replacement at this time  BMP scheduled with am labs.   Will replace for goal electrolytes WNL.   Pharmacy will continue to monitor and adjust per consult.   Arelia Volpe A 05/01/2018 8:06 AM

## 2018-05-01 NOTE — Progress Notes (Signed)
Discharge instructions given and went over with patient and patients granddaughter at bedside. All questions answered. Patient discharged home with family via wheelchair by volunteer services. Madlyn Frankel, RN

## 2018-05-01 NOTE — Discharge Instructions (Signed)
Community-Acquired Pneumonia, Adult  Pneumonia is an infection of the lungs. It causes swelling in the airways of the lungs. Mucus and fluid may also build up inside the airways.  One type of pneumonia can happen while a person is in a hospital. A different type can happen when a person is not in a hospital (community-acquired pneumonia).   What are the causes?    This condition is caused by germs (viruses, bacteria, or fungi). Some types of germs can be passed from one person to another. This can happen when you breathe in droplets from the cough or sneeze of an infected person.  What increases the risk?  You are more likely to develop this condition if you:   Have a long-term (chronic) disease, such as:  ? Chronic obstructive pulmonary disease (COPD).  ? Asthma.  ? Cystic fibrosis.  ? Congestive heart failure.  ? Diabetes.  ? Kidney disease.   Have HIV.   Have sickle cell disease.   Have had your spleen removed.   Do not take good care of your teeth and mouth (poor dental hygiene).   Have a medical condition that increases the risk of breathing in droplets from your own mouth and nose.   Have a weakened body defense system (immune system).   Are a smoker.   Travel to areas where the germs that cause this illness are common.   Are around certain animals or the places they live.  What are the signs or symptoms?   A dry cough.   A wet (productive) cough.   Fever.   Sweating.   Chest pain. This often happens when breathing deeply or coughing.   Fast breathing or trouble breathing.   Shortness of breath.   Shaking chills.   Feeling tired (fatigue).   Muscle aches.  How is this treated?  Treatment for this condition depends on many things. Most adults can be treated at home. In some cases, treatment must happen in a hospital. Treatment may include:   Medicines given by mouth or through an IV tube.   Being given extra oxygen.   Respiratory therapy.  In rare cases, treatment for very bad pneumonia  may include:   Using a machine to help you breathe.   Having a procedure to remove fluid from around your lungs.  Follow these instructions at home:  Medicines   Take over-the-counter and prescription medicines only as told by your doctor.  ? Only take cough medicine if you are losing sleep.   If you were prescribed an antibiotic medicine, take it as told by your doctor. Do not stop taking the antibiotic even if you start to feel better.  General instructions     Sleep with your head and neck raised (elevated). You can do this by sleeping in a recliner or by putting a few pillows under your head.   Rest as needed. Get at least 8 hours of sleep each night.   Drink enough water to keep your pee (urine) pale yellow.   Eat a healthy diet that includes plenty of vegetables, fruits, whole grains, low-fat dairy products, and lean protein.   Do not use any products that contain nicotine or tobacco. These include cigarettes, e-cigarettes, and chewing tobacco. If you need help quitting, ask your doctor.   Keep all follow-up visits as told by your doctor. This is important.  How is this prevented?  A shot (vaccine) can help prevent pneumonia. Shots are often suggested for:   People   older than 83 years of age.   People older than 83 years of age who:  ? Are having cancer treatment.  ? Have long-term (chronic) lung disease.  ? Have problems with their body's defense system.  You may also prevent pneumonia if you take these actions:   Get the flu (influenza) shot every year.   Go to the dentist as often as told.   Wash your hands often. If you cannot use soap and water, use hand sanitizer.  Contact a doctor if:   You have a fever.   You lose sleep because your cough medicine does not help.  Get help right away if:   You are short of breath and it gets worse.   You have more chest pain.   Your sickness gets worse. This is very serious if:  ? You are an older adult.  ? Your body's defense system is weak.   You  cough up blood.  Summary   Pneumonia is an infection of the lungs.   Most adults can be treated at home. Some will need treatment in a hospital.   Drink enough water to keep your pee pale yellow.   Get at least 8 hours of sleep each night.  This information is not intended to replace advice given to you by your health care provider. Make sure you discuss any questions you have with your health care provider.  Document Released: 09/07/2007 Document Revised: 11/16/2017 Document Reviewed: 11/16/2017  Elsevier Interactive Patient Education  2019 [employer]vier Inc[employer]

## 2018-05-01 NOTE — Care Management Note (Signed)
Case Management Note  Patient Details  Name: Sharon Barajas MRN: [id number] Date of Birth: 04-Jul-1931  Subjective/Objective:    Admitted to [facility] with the diagnosis of pneumonia. Lives alone. Granddaughter is [name] [phone]). Seen Dr. Netty Starring 2 weeks ago. Prescriptions are filled at [facility].  No home health. No skilled facility. No home oxygen. Takes care of all basic activities of daily living herself, doesn't drive. Family helps with errands. Rolling walker in the home.  No falls. Fair appetite. Granddaughter will transport. Discharge to home today per Dr. Manuella Ghazi                Action/Plan: [place] list query completed. One copy placed on chart. One copy given to granddaughter. [place]. Will update [name], [facility] representative.   Expected Discharge Date:  05/01/18               Expected Discharge Plan:     In-House Referral:   yes  Discharge planning Services   yes  Post Acute Care Choice:   yes Choice offered to:   granddaughter  DME Arranged:    DME Agency:     HH Arranged:   yes HH Agency:    [facility] Status of Service:     If discussed at [place] of Stay Meetings, dates discussed:    Additional Comments:  [name], RN MSN CCM Care Management [phone] 05/01/2018, 8:29 AM

## 2018-05-02 DIAGNOSIS — Z7902 Long term (current) use of antithrombotics/antiplatelets: Secondary | ICD-10-CM | POA: Diagnosis not present

## 2018-05-02 DIAGNOSIS — J189 Pneumonia, unspecified organism: Secondary | ICD-10-CM | POA: Diagnosis not present

## 2018-05-02 DIAGNOSIS — I6523 Occlusion and stenosis of bilateral carotid arteries: Secondary | ICD-10-CM | POA: Diagnosis not present

## 2018-05-02 DIAGNOSIS — E785 Hyperlipidemia, unspecified: Secondary | ICD-10-CM | POA: Diagnosis not present

## 2018-05-02 DIAGNOSIS — Z9181 History of falling: Secondary | ICD-10-CM | POA: Diagnosis not present

## 2018-05-02 DIAGNOSIS — Z792 Long term (current) use of antibiotics: Secondary | ICD-10-CM | POA: Diagnosis not present

## 2018-05-02 DIAGNOSIS — I10 Essential (primary) hypertension: Secondary | ICD-10-CM | POA: Diagnosis not present

## 2018-05-02 DIAGNOSIS — Z7982 Long term (current) use of aspirin: Secondary | ICD-10-CM | POA: Diagnosis not present

## 2018-05-03 DIAGNOSIS — E785 Hyperlipidemia, unspecified: Secondary | ICD-10-CM | POA: Diagnosis not present

## 2018-05-03 DIAGNOSIS — Z7902 Long term (current) use of antithrombotics/antiplatelets: Secondary | ICD-10-CM | POA: Diagnosis not present

## 2018-05-03 DIAGNOSIS — I10 Essential (primary) hypertension: Secondary | ICD-10-CM | POA: Diagnosis not present

## 2018-05-03 DIAGNOSIS — I6523 Occlusion and stenosis of bilateral carotid arteries: Secondary | ICD-10-CM | POA: Diagnosis not present

## 2018-05-03 DIAGNOSIS — Z792 Long term (current) use of antibiotics: Secondary | ICD-10-CM | POA: Diagnosis not present

## 2018-05-03 DIAGNOSIS — J189 Pneumonia, unspecified organism: Secondary | ICD-10-CM | POA: Diagnosis not present

## 2018-05-03 DIAGNOSIS — Z7982 Long term (current) use of aspirin: Secondary | ICD-10-CM | POA: Diagnosis not present

## 2018-05-03 DIAGNOSIS — Z9181 History of falling: Secondary | ICD-10-CM | POA: Diagnosis not present

## 2018-05-03 NOTE — Discharge Summary (Signed)
[facility] [place] [facility] NAME: Sharon Barajas    MR#:  [id number]  DATE OF BIRTH:  06-01-1931  DATE OF ADMISSION:  04/29/2018   ADMITTING PHYSICIAN: Quintella Baton, MD  DATE OF DISCHARGE: 05/01/2018 10:19 AM  PRIMARY CARE PHYSICIAN: Dion Body, MD   ADMISSION DIAGNOSIS:  Community acquired pneumonia, unspecified laterality [J18.9] Sepsis, due to unspecified organism, unspecified whether acute organ dysfunction present ([place]) [A41.9] DISCHARGE DIAGNOSIS:  Principal Problem:   Community acquired pneumonia Active Problems:   Essential hypertension   Hyperlipidemia   AKI (acute kidney injury) ([place])   Elevated troponin   Pneumonia  SECONDARY DIAGNOSIS:   Past Medical History:  Diagnosis Date  . Carotid artery stenosis    bilateral  . 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  . Hypercholesteremia   . Hypertension    HOSPITAL COURSE:   85 y f admitted for Pneumonia  . Community acquired pneumonia/lactic acidosis - treated with Rocephin andazithromycin while in the Hospital and responded well. -Influenza negative  . AKI (acute kidney injury) ([place]) -prerenal, improved with hydration  . Elevated troponin - due to supply demand ischemia secondary to pneumonia and acute kidney injury, neg serial troponins  . Essential hypertension -Stable  . Hyperlipidemia -Stable  DISCHARGE CONDITIONS:  stable CONSULTS OBTAINED:   DRUG ALLERGIES:  No Known Allergies DISCHARGE MEDICATIONS:   Allergies as of 05/01/2018   No Known Allergies     Medication List    STOP taking these medications   oxyCODONE-acetaminophen 5-325 MG tablet Commonly known as:  PERCOCET/ROXICET     TAKE these medications   amLODipine 10 MG tablet Commonly known as:  NORVASC Take 10 mg by mouth every morning.   amoxicillin-clavulanate 875-125 MG tablet Commonly known as:  AUGMENTIN Take 1 tablet by  mouth every 12 (twelve) hours for 7 days.   aspirin 81 MG tablet Take 81 mg by mouth every morning.   atorvastatin 10 MG tablet Commonly known as:  LIPITOR   Calcium Carbonate-Vitamin D 600-400 MG-UNIT tablet Take by mouth.   clopidogrel 75 MG tablet Commonly known as:  PLAVIX Take 1 tablet (75 mg total) by mouth daily.   ergocalciferol 1.25 MG (50000 UT) capsule Commonly known as:  VITAMIN D2 Take 50,000 Units by mouth once a week.   ICAPS Caps Take 1 capsule by mouth 2 (two) times daily.   lisinopril 20 MG tablet Commonly known as:  PRINIVIL,ZESTRIL Take 20 mg by mouth at bedtime.   loperamide 2 MG tablet Commonly known as:  IMODIUM A-D Take 1 tablet (2 mg total) by mouth 4 (four) times daily as needed for diarrhea or loose stools.   nitroGLYCERIN 0.4 MG SL tablet Commonly known as:  NITROSTAT Place 0.4 mg under the tongue every 5 (five) minutes as needed for chest pain.   ondansetron 4 MG disintegrating tablet Commonly known as:  ZOFRAN ODT Take 1 tablet (4 mg total) by mouth every 8 (eight) hours as needed for nausea or vomiting.        DISCHARGE INSTRUCTIONS:   DIET:  Cardiac diet DISCHARGE CONDITION:  Stable ACTIVITY:  Activity as tolerated OXYGEN:  Home Oxygen: No.  Oxygen Delivery: room air DISCHARGE LOCATION:  home   If you experience worsening of your admission symptoms, develop shortness of breath, life threatening emergency, suicidal or homicidal thoughts you must seek medical attention immediately by calling 911 or calling your MD  immediately  if symptoms less severe.  You Must read complete instructions/literature along with all the possible adverse reactions/side effects for all the Medicines you take and that have been prescribed to you. Take any new Medicines after you have completely understood and accpet all the possible adverse reactions/side effects.   Please note  You were cared for by a hospitalist during your hospital stay. If you  have any questions about your discharge medications or the care you received while you were in the hospital after you are discharged, you can call the unit and asked to speak with the hospitalist on call if the hospitalist that took care of you is not available. Once you are discharged, your primary care physician will handle any further medical issues. Please note that NO REFILLS for any discharge medications will be authorized once you are discharged, as it is imperative that you return to your primary care physician (or establish a relationship with a primary care physician if you do not have one) for your aftercare needs so that they can reassess your need for medications and monitor your lab values.    On the day of Discharge:  VITAL SIGNS:  Blood pressure (!) 165/62, pulse (!) 102, temperature 98.1 F (36.7 C), temperature source Oral, resp. rate 16, height 5' (1.524 m), weight 48.1 kg, SpO2 93 %. PHYSICAL EXAMINATION:  GENERAL:  85 y.o.-year-old patient lying in the bed with no acute distress.  EYES: Pupils equal, round, reactive to light and accommodation. No scleral icterus. Extraocular muscles intact.  HEENT: Head atraumatic, normocephalic. Oropharynx and nasopharynx clear.  NECK:  Supple, no jugular venous distention. No thyroid enlargement, no tenderness.  LUNGS: Normal breath sounds bilaterally, no wheezing, rales,rhonchi or crepitation. No use of accessory muscles of respiration.  CARDIOVASCULAR: S1, S2 normal. No murmurs, rubs, or gallops.  ABDOMEN: Soft, non-tender, non-distended. Bowel sounds present. No organomegaly or mass.  EXTREMITIES: No pedal edema, cyanosis, or clubbing.  NEUROLOGIC: Cranial nerves II through XII are intact. Muscle strength 5/5 in all extremities. Sensation intact. Gait not checked.  PSYCHIATRIC: The patient is alert and oriented x 3.  SKIN: No obvious rash, lesion, or ulcer.  DATA REVIEW:   CBC Recent Labs  Lab 05/01/18 0610  WBC 6.6  HGB 14.9    HCT 47.9*  PLT 165    Chemistries  Recent Labs  Lab 04/29/18 1649  05/01/18 0435  NA 138   < > 140  K 3.5   < > 3.5  CL 104   < > 109  CO2 21*   < > 23  GLUCOSE 112*   < > 99  BUN 34*   < > 33*  CREATININE 1.62*   < > 1.51*  CALCIUM 9.1   < > 8.8*  AST 22  --   --   ALT 18  --   --   ALKPHOS 86  --   --   BILITOT 1.5*  --   --    < > = values in this interval not displayed.    Follow-up Information    Dion Body, MD. Go on 05/07/2018.   Specialty:  Family Medicine Why:  @ 12:45 p.m. Contact information: [place] [address] [phone]        Erby Pian, MD. Go on 05/22/2018.   Specialty:  Specialist Why:  at 10:00 a.m. Contact information: [place] [address] [phone]  Management plans discussed with the patient, family and they are in agreement.  CODE STATUS: Prior   TOTAL TIME TAKING CARE OF THIS PATIENT: 45 minutes.    Max Sane M.D on 05/03/2018 at 9:49 PM  Between 7am to 6pm - Pager - [phone]  After 6pm go to [email or web address] - Proofreader  [facility]  Office  [phone]  CC: Primary care physician; Dion Body, MD   Note: This dictation was prepared with Dragon dictation along with smaller phrase technology. Any transcriptional errors that result from this process are unintentional.

## 2018-05-04 DIAGNOSIS — Z7902 Long term (current) use of antithrombotics/antiplatelets: Secondary | ICD-10-CM | POA: Diagnosis not present

## 2018-05-04 DIAGNOSIS — J189 Pneumonia, unspecified organism: Secondary | ICD-10-CM | POA: Diagnosis not present

## 2018-05-04 DIAGNOSIS — I10 Essential (primary) hypertension: Secondary | ICD-10-CM | POA: Diagnosis not present

## 2018-05-04 DIAGNOSIS — I6523 Occlusion and stenosis of bilateral carotid arteries: Secondary | ICD-10-CM | POA: Diagnosis not present

## 2018-05-04 DIAGNOSIS — Z792 Long term (current) use of antibiotics: Secondary | ICD-10-CM | POA: Diagnosis not present

## 2018-05-04 DIAGNOSIS — Z7982 Long term (current) use of aspirin: Secondary | ICD-10-CM | POA: Diagnosis not present

## 2018-05-04 DIAGNOSIS — Z9181 History of falling: Secondary | ICD-10-CM | POA: Diagnosis not present

## 2018-05-04 DIAGNOSIS — E785 Hyperlipidemia, unspecified: Secondary | ICD-10-CM | POA: Diagnosis not present

## 2018-05-04 LAB — CULTURE, BLOOD (ROUTINE X 2)
CULTURE: NO GROWTH
Culture: NO GROWTH
SPECIAL REQUESTS: ADEQUATE
SPECIAL REQUESTS: ADEQUATE

## 2018-05-07 DIAGNOSIS — Z7902 Long term (current) use of antithrombotics/antiplatelets: Secondary | ICD-10-CM | POA: Diagnosis not present

## 2018-05-07 DIAGNOSIS — E785 Hyperlipidemia, unspecified: Secondary | ICD-10-CM | POA: Diagnosis not present

## 2018-05-07 DIAGNOSIS — Z7982 Long term (current) use of aspirin: Secondary | ICD-10-CM | POA: Diagnosis not present

## 2018-05-07 DIAGNOSIS — Z792 Long term (current) use of antibiotics: Secondary | ICD-10-CM | POA: Diagnosis not present

## 2018-05-07 DIAGNOSIS — I10 Essential (primary) hypertension: Secondary | ICD-10-CM | POA: Diagnosis not present

## 2018-05-07 DIAGNOSIS — Z9181 History of falling: Secondary | ICD-10-CM | POA: Diagnosis not present

## 2018-05-07 DIAGNOSIS — I6523 Occlusion and stenosis of bilateral carotid arteries: Secondary | ICD-10-CM | POA: Diagnosis not present

## 2018-05-07 DIAGNOSIS — J189 Pneumonia, unspecified organism: Secondary | ICD-10-CM | POA: Diagnosis not present

## 2018-05-07 DIAGNOSIS — K219 Gastro-esophageal reflux disease without esophagitis: Secondary | ICD-10-CM | POA: Diagnosis not present

## 2018-05-09 DIAGNOSIS — Z7902 Long term (current) use of antithrombotics/antiplatelets: Secondary | ICD-10-CM | POA: Diagnosis not present

## 2018-05-09 DIAGNOSIS — Z7982 Long term (current) use of aspirin: Secondary | ICD-10-CM | POA: Diagnosis not present

## 2018-05-09 DIAGNOSIS — Z9181 History of falling: Secondary | ICD-10-CM | POA: Diagnosis not present

## 2018-05-09 DIAGNOSIS — J189 Pneumonia, unspecified organism: Secondary | ICD-10-CM | POA: Diagnosis not present

## 2018-05-09 DIAGNOSIS — I6523 Occlusion and stenosis of bilateral carotid arteries: Secondary | ICD-10-CM | POA: Diagnosis not present

## 2018-05-09 DIAGNOSIS — Z792 Long term (current) use of antibiotics: Secondary | ICD-10-CM | POA: Diagnosis not present

## 2018-05-09 DIAGNOSIS — E785 Hyperlipidemia, unspecified: Secondary | ICD-10-CM | POA: Diagnosis not present

## 2018-05-09 DIAGNOSIS — I10 Essential (primary) hypertension: Secondary | ICD-10-CM | POA: Diagnosis not present

## 2018-05-10 DIAGNOSIS — Z792 Long term (current) use of antibiotics: Secondary | ICD-10-CM | POA: Diagnosis not present

## 2018-05-10 DIAGNOSIS — J189 Pneumonia, unspecified organism: Secondary | ICD-10-CM | POA: Diagnosis not present

## 2018-05-10 DIAGNOSIS — Z7982 Long term (current) use of aspirin: Secondary | ICD-10-CM | POA: Diagnosis not present

## 2018-05-10 DIAGNOSIS — Z9181 History of falling: Secondary | ICD-10-CM | POA: Diagnosis not present

## 2018-05-10 DIAGNOSIS — I10 Essential (primary) hypertension: Secondary | ICD-10-CM | POA: Diagnosis not present

## 2018-05-10 DIAGNOSIS — Z7902 Long term (current) use of antithrombotics/antiplatelets: Secondary | ICD-10-CM | POA: Diagnosis not present

## 2018-05-10 DIAGNOSIS — E785 Hyperlipidemia, unspecified: Secondary | ICD-10-CM | POA: Diagnosis not present

## 2018-05-10 DIAGNOSIS — I6523 Occlusion and stenosis of bilateral carotid arteries: Secondary | ICD-10-CM | POA: Diagnosis not present

## 2018-05-11 DIAGNOSIS — Z792 Long term (current) use of antibiotics: Secondary | ICD-10-CM | POA: Diagnosis not present

## 2018-05-11 DIAGNOSIS — J189 Pneumonia, unspecified organism: Secondary | ICD-10-CM | POA: Diagnosis not present

## 2018-05-11 DIAGNOSIS — Z7902 Long term (current) use of antithrombotics/antiplatelets: Secondary | ICD-10-CM | POA: Diagnosis not present

## 2018-05-11 DIAGNOSIS — I6523 Occlusion and stenosis of bilateral carotid arteries: Secondary | ICD-10-CM | POA: Diagnosis not present

## 2018-05-11 DIAGNOSIS — E785 Hyperlipidemia, unspecified: Secondary | ICD-10-CM | POA: Diagnosis not present

## 2018-05-11 DIAGNOSIS — I10 Essential (primary) hypertension: Secondary | ICD-10-CM | POA: Diagnosis not present

## 2018-05-11 DIAGNOSIS — Z7982 Long term (current) use of aspirin: Secondary | ICD-10-CM | POA: Diagnosis not present

## 2018-05-11 DIAGNOSIS — Z9181 History of falling: Secondary | ICD-10-CM | POA: Diagnosis not present

## 2018-05-14 DIAGNOSIS — J189 Pneumonia, unspecified organism: Secondary | ICD-10-CM | POA: Diagnosis not present

## 2018-05-14 DIAGNOSIS — Z7982 Long term (current) use of aspirin: Secondary | ICD-10-CM | POA: Diagnosis not present

## 2018-05-14 DIAGNOSIS — Z9181 History of falling: Secondary | ICD-10-CM | POA: Diagnosis not present

## 2018-05-14 DIAGNOSIS — Z792 Long term (current) use of antibiotics: Secondary | ICD-10-CM | POA: Diagnosis not present

## 2018-05-14 DIAGNOSIS — E785 Hyperlipidemia, unspecified: Secondary | ICD-10-CM | POA: Diagnosis not present

## 2018-05-14 DIAGNOSIS — Z7902 Long term (current) use of antithrombotics/antiplatelets: Secondary | ICD-10-CM | POA: Diagnosis not present

## 2018-05-14 DIAGNOSIS — I6523 Occlusion and stenosis of bilateral carotid arteries: Secondary | ICD-10-CM | POA: Diagnosis not present

## 2018-05-14 DIAGNOSIS — I10 Essential (primary) hypertension: Secondary | ICD-10-CM | POA: Diagnosis not present

## 2018-05-16 DIAGNOSIS — Z7982 Long term (current) use of aspirin: Secondary | ICD-10-CM | POA: Diagnosis not present

## 2018-05-16 DIAGNOSIS — E785 Hyperlipidemia, unspecified: Secondary | ICD-10-CM | POA: Diagnosis not present

## 2018-05-16 DIAGNOSIS — Z792 Long term (current) use of antibiotics: Secondary | ICD-10-CM | POA: Diagnosis not present

## 2018-05-16 DIAGNOSIS — Z7902 Long term (current) use of antithrombotics/antiplatelets: Secondary | ICD-10-CM | POA: Diagnosis not present

## 2018-05-16 DIAGNOSIS — I10 Essential (primary) hypertension: Secondary | ICD-10-CM | POA: Diagnosis not present

## 2018-05-16 DIAGNOSIS — I6523 Occlusion and stenosis of bilateral carotid arteries: Secondary | ICD-10-CM | POA: Diagnosis not present

## 2018-05-16 DIAGNOSIS — J189 Pneumonia, unspecified organism: Secondary | ICD-10-CM | POA: Diagnosis not present

## 2018-05-16 DIAGNOSIS — Z9181 History of falling: Secondary | ICD-10-CM | POA: Diagnosis not present

## 2018-05-21 DIAGNOSIS — I10 Essential (primary) hypertension: Secondary | ICD-10-CM | POA: Diagnosis not present

## 2018-05-21 DIAGNOSIS — Z7982 Long term (current) use of aspirin: Secondary | ICD-10-CM | POA: Diagnosis not present

## 2018-05-21 DIAGNOSIS — Z792 Long term (current) use of antibiotics: Secondary | ICD-10-CM | POA: Diagnosis not present

## 2018-05-21 DIAGNOSIS — E785 Hyperlipidemia, unspecified: Secondary | ICD-10-CM | POA: Diagnosis not present

## 2018-05-21 DIAGNOSIS — J189 Pneumonia, unspecified organism: Secondary | ICD-10-CM | POA: Diagnosis not present

## 2018-05-21 DIAGNOSIS — Z7902 Long term (current) use of antithrombotics/antiplatelets: Secondary | ICD-10-CM | POA: Diagnosis not present

## 2018-05-21 DIAGNOSIS — I6523 Occlusion and stenosis of bilateral carotid arteries: Secondary | ICD-10-CM | POA: Diagnosis not present

## 2018-05-22 ENCOUNTER — Other Ambulatory Visit: Payer: Self-pay | Admitting: Pulmonary Disease

## 2018-05-22 DIAGNOSIS — R0609 Other forms of dyspnea: Principal | ICD-10-CM

## 2018-05-22 DIAGNOSIS — J90 Pleural effusion, not elsewhere classified: Secondary | ICD-10-CM | POA: Diagnosis not present

## 2018-05-22 DIAGNOSIS — J189 Pneumonia, unspecified organism: Secondary | ICD-10-CM

## 2018-05-23 DIAGNOSIS — E785 Hyperlipidemia, unspecified: Secondary | ICD-10-CM | POA: Diagnosis not present

## 2018-05-23 DIAGNOSIS — Z792 Long term (current) use of antibiotics: Secondary | ICD-10-CM | POA: Diagnosis not present

## 2018-05-23 DIAGNOSIS — I10 Essential (primary) hypertension: Secondary | ICD-10-CM | POA: Diagnosis not present

## 2018-05-23 DIAGNOSIS — J189 Pneumonia, unspecified organism: Secondary | ICD-10-CM | POA: Diagnosis not present

## 2018-05-23 DIAGNOSIS — Z7982 Long term (current) use of aspirin: Secondary | ICD-10-CM | POA: Diagnosis not present

## 2018-05-23 DIAGNOSIS — I6523 Occlusion and stenosis of bilateral carotid arteries: Secondary | ICD-10-CM | POA: Diagnosis not present

## 2018-05-23 DIAGNOSIS — Z9181 History of falling: Secondary | ICD-10-CM | POA: Diagnosis not present

## 2018-05-23 DIAGNOSIS — Z7902 Long term (current) use of antithrombotics/antiplatelets: Secondary | ICD-10-CM | POA: Diagnosis not present

## 2018-06-20 ENCOUNTER — Ambulatory Visit
Admission: RE | Admit: 2018-06-20 | Discharge: 2018-06-20 | Disposition: A | Discharge status: Home / Self Care | Payer: Medicare HMO | Source: Ambulatory Visit | Attending: Pulmonary Disease | Admitting: Pulmonary Disease

## 2018-06-20 ENCOUNTER — Other Ambulatory Visit: Payer: Self-pay

## 2018-06-20 DIAGNOSIS — R0609 Other forms of dyspnea: Secondary | ICD-10-CM | POA: Diagnosis not present

## 2018-06-20 DIAGNOSIS — Z09 Encounter for follow-up examination after completed treatment for conditions other than malignant neoplasm: Secondary | ICD-10-CM | POA: Diagnosis not present

## 2018-06-20 DIAGNOSIS — J90 Pleural effusion, not elsewhere classified: Secondary | ICD-10-CM | POA: Diagnosis not present

## 2018-06-20 DIAGNOSIS — J189 Pneumonia, unspecified organism: Secondary | ICD-10-CM | POA: Insufficient documentation

## 2018-07-20 ENCOUNTER — Encounter (INDEPENDENT_AMBULATORY_CARE_PROVIDER_SITE_OTHER): Payer: Medicare HMO

## 2018-07-20 ENCOUNTER — Ambulatory Visit (INDEPENDENT_AMBULATORY_CARE_PROVIDER_SITE_OTHER): Payer: Medicare HMO | Admitting: Vascular Surgery

## 2018-07-25 DIAGNOSIS — I10 Essential (primary) hypertension: Secondary | ICD-10-CM | POA: Diagnosis not present

## 2018-07-25 DIAGNOSIS — E782 Mixed hyperlipidemia: Secondary | ICD-10-CM | POA: Diagnosis not present

## 2018-07-25 DIAGNOSIS — I6523 Occlusion and stenosis of bilateral carotid arteries: Secondary | ICD-10-CM | POA: Diagnosis not present

## 2018-07-25 DIAGNOSIS — I340 Nonrheumatic mitral (valve) insufficiency: Secondary | ICD-10-CM | POA: Diagnosis not present

## 2018-07-30 DIAGNOSIS — E782 Mixed hyperlipidemia: Secondary | ICD-10-CM | POA: Diagnosis not present

## 2018-07-30 DIAGNOSIS — I10 Essential (primary) hypertension: Secondary | ICD-10-CM | POA: Diagnosis not present

## 2018-08-06 DIAGNOSIS — Z0000 Encounter for general adult medical examination without abnormal findings: Secondary | ICD-10-CM | POA: Diagnosis not present

## 2018-08-06 DIAGNOSIS — N183 Chronic kidney disease, stage 3 unspecified: Secondary | ICD-10-CM | POA: Insufficient documentation

## 2018-08-06 DIAGNOSIS — D692 Other nonthrombocytopenic purpura: Secondary | ICD-10-CM | POA: Insufficient documentation

## 2018-08-06 DIAGNOSIS — E782 Mixed hyperlipidemia: Secondary | ICD-10-CM | POA: Diagnosis not present

## 2018-08-06 DIAGNOSIS — K219 Gastro-esophageal reflux disease without esophagitis: Secondary | ICD-10-CM | POA: Diagnosis not present

## 2018-08-06 DIAGNOSIS — I10 Essential (primary) hypertension: Secondary | ICD-10-CM | POA: Diagnosis not present

## 2018-08-08 ENCOUNTER — Other Ambulatory Visit (INDEPENDENT_AMBULATORY_CARE_PROVIDER_SITE_OTHER): Payer: Self-pay | Admitting: Vascular Surgery

## 2018-08-23 DIAGNOSIS — J189 Pneumonia, unspecified organism: Secondary | ICD-10-CM | POA: Diagnosis not present

## 2018-08-28 ENCOUNTER — Ambulatory Visit (INDEPENDENT_AMBULATORY_CARE_PROVIDER_SITE_OTHER): Payer: Medicare HMO | Admitting: Nurse Practitioner

## 2018-08-28 ENCOUNTER — Other Ambulatory Visit: Payer: Self-pay

## 2018-08-28 ENCOUNTER — Encounter (INDEPENDENT_AMBULATORY_CARE_PROVIDER_SITE_OTHER): Payer: Self-pay

## 2018-08-28 ENCOUNTER — Ambulatory Visit (INDEPENDENT_AMBULATORY_CARE_PROVIDER_SITE_OTHER): Payer: Medicare HMO

## 2018-08-28 DIAGNOSIS — I6523 Occlusion and stenosis of bilateral carotid arteries: Secondary | ICD-10-CM | POA: Diagnosis not present

## 2018-08-30 ENCOUNTER — Other Ambulatory Visit (INDEPENDENT_AMBULATORY_CARE_PROVIDER_SITE_OTHER): Payer: Self-pay | Admitting: Vascular Surgery

## 2018-08-30 DIAGNOSIS — I6523 Occlusion and stenosis of bilateral carotid arteries: Secondary | ICD-10-CM

## 2018-09-03 ENCOUNTER — Encounter (INDEPENDENT_AMBULATORY_CARE_PROVIDER_SITE_OTHER): Payer: Self-pay | Admitting: Vascular Surgery

## 2018-11-20 DIAGNOSIS — Z23 Encounter for immunization: Secondary | ICD-10-CM | POA: Diagnosis not present

## 2018-11-30 DIAGNOSIS — I10 Essential (primary) hypertension: Secondary | ICD-10-CM | POA: Diagnosis not present

## 2018-11-30 DIAGNOSIS — E782 Mixed hyperlipidemia: Secondary | ICD-10-CM | POA: Diagnosis not present

## 2018-12-07 DIAGNOSIS — N183 Chronic kidney disease, stage 3 (moderate): Secondary | ICD-10-CM | POA: Diagnosis not present

## 2018-12-07 DIAGNOSIS — I10 Essential (primary) hypertension: Secondary | ICD-10-CM | POA: Diagnosis not present

## 2018-12-07 DIAGNOSIS — E782 Mixed hyperlipidemia: Secondary | ICD-10-CM | POA: Diagnosis not present

## 2019-01-11 ENCOUNTER — Other Ambulatory Visit (INDEPENDENT_AMBULATORY_CARE_PROVIDER_SITE_OTHER): Payer: Self-pay | Admitting: Vascular Surgery

## 2019-01-24 DIAGNOSIS — E782 Mixed hyperlipidemia: Secondary | ICD-10-CM | POA: Diagnosis not present

## 2019-01-24 DIAGNOSIS — I6523 Occlusion and stenosis of bilateral carotid arteries: Secondary | ICD-10-CM | POA: Diagnosis not present

## 2019-01-24 DIAGNOSIS — I340 Nonrheumatic mitral (valve) insufficiency: Secondary | ICD-10-CM | POA: Diagnosis not present

## 2019-01-24 DIAGNOSIS — I10 Essential (primary) hypertension: Secondary | ICD-10-CM | POA: Diagnosis not present

## 2019-01-24 DIAGNOSIS — R0789 Other chest pain: Secondary | ICD-10-CM | POA: Diagnosis not present

## 2019-02-19 DIAGNOSIS — R0789 Other chest pain: Secondary | ICD-10-CM | POA: Diagnosis not present

## 2019-02-22 DIAGNOSIS — I340 Nonrheumatic mitral (valve) insufficiency: Secondary | ICD-10-CM | POA: Diagnosis not present

## 2019-02-22 DIAGNOSIS — E782 Mixed hyperlipidemia: Secondary | ICD-10-CM | POA: Diagnosis not present

## 2019-02-22 DIAGNOSIS — I10 Essential (primary) hypertension: Secondary | ICD-10-CM | POA: Diagnosis not present

## 2019-02-22 DIAGNOSIS — I6523 Occlusion and stenosis of bilateral carotid arteries: Secondary | ICD-10-CM | POA: Diagnosis not present

## 2019-04-10 DIAGNOSIS — E782 Mixed hyperlipidemia: Secondary | ICD-10-CM | POA: Diagnosis not present

## 2019-04-10 DIAGNOSIS — N183 Chronic kidney disease, stage 3 unspecified: Secondary | ICD-10-CM | POA: Diagnosis not present

## 2019-04-10 DIAGNOSIS — I10 Essential (primary) hypertension: Secondary | ICD-10-CM | POA: Diagnosis not present

## 2019-04-17 DIAGNOSIS — Z79899 Other long term (current) drug therapy: Secondary | ICD-10-CM | POA: Diagnosis not present

## 2019-04-17 DIAGNOSIS — D692 Other nonthrombocytopenic purpura: Secondary | ICD-10-CM | POA: Diagnosis not present

## 2019-04-17 DIAGNOSIS — N1832 Chronic kidney disease, stage 3b: Secondary | ICD-10-CM | POA: Diagnosis not present

## 2019-04-17 DIAGNOSIS — I129 Hypertensive chronic kidney disease with stage 1 through stage 4 chronic kidney disease, or unspecified chronic kidney disease: Secondary | ICD-10-CM | POA: Diagnosis not present

## 2019-04-17 DIAGNOSIS — E782 Mixed hyperlipidemia: Secondary | ICD-10-CM | POA: Diagnosis not present

## 2019-05-30 DIAGNOSIS — R0600 Dyspnea, unspecified: Secondary | ICD-10-CM | POA: Diagnosis not present

## 2019-08-08 DIAGNOSIS — I10 Essential (primary) hypertension: Secondary | ICD-10-CM | POA: Diagnosis not present

## 2019-08-08 DIAGNOSIS — E782 Mixed hyperlipidemia: Secondary | ICD-10-CM | POA: Diagnosis not present

## 2019-08-15 DIAGNOSIS — E785 Hyperlipidemia, unspecified: Secondary | ICD-10-CM | POA: Diagnosis not present

## 2019-08-15 DIAGNOSIS — I10 Essential (primary) hypertension: Secondary | ICD-10-CM | POA: Diagnosis not present

## 2019-08-15 DIAGNOSIS — Z0000 Encounter for general adult medical examination without abnormal findings: Secondary | ICD-10-CM | POA: Diagnosis not present

## 2019-08-22 DIAGNOSIS — E782 Mixed hyperlipidemia: Secondary | ICD-10-CM | POA: Diagnosis not present

## 2019-08-22 DIAGNOSIS — I10 Essential (primary) hypertension: Secondary | ICD-10-CM | POA: Diagnosis not present

## 2019-08-22 DIAGNOSIS — I6523 Occlusion and stenosis of bilateral carotid arteries: Secondary | ICD-10-CM | POA: Diagnosis not present

## 2019-08-22 DIAGNOSIS — N1832 Chronic kidney disease, stage 3b: Secondary | ICD-10-CM | POA: Diagnosis not present

## 2019-08-30 ENCOUNTER — Ambulatory Visit (INDEPENDENT_AMBULATORY_CARE_PROVIDER_SITE_OTHER): Payer: Medicare HMO | Admitting: Vascular Surgery

## 2019-08-30 ENCOUNTER — Encounter (INDEPENDENT_AMBULATORY_CARE_PROVIDER_SITE_OTHER): Payer: Medicare HMO

## 2019-09-13 ENCOUNTER — Encounter (INDEPENDENT_AMBULATORY_CARE_PROVIDER_SITE_OTHER): Payer: Self-pay | Admitting: Vascular Surgery

## 2019-09-13 ENCOUNTER — Other Ambulatory Visit: Payer: Self-pay

## 2019-09-13 ENCOUNTER — Ambulatory Visit (INDEPENDENT_AMBULATORY_CARE_PROVIDER_SITE_OTHER): Payer: Medicare HMO | Admitting: Vascular Surgery

## 2019-09-13 ENCOUNTER — Ambulatory Visit (INDEPENDENT_AMBULATORY_CARE_PROVIDER_SITE_OTHER): Payer: Medicare HMO

## 2019-09-13 VITALS — BP 189/72 | HR 83 | Ht 59.0 in | Wt 110.0 lb

## 2019-09-13 DIAGNOSIS — I6523 Occlusion and stenosis of bilateral carotid arteries: Secondary | ICD-10-CM | POA: Diagnosis not present

## 2019-09-13 DIAGNOSIS — E785 Hyperlipidemia, unspecified: Secondary | ICD-10-CM

## 2019-09-13 DIAGNOSIS — K219 Gastro-esophageal reflux disease without esophagitis: Secondary | ICD-10-CM | POA: Insufficient documentation

## 2019-09-13 DIAGNOSIS — I10 Essential (primary) hypertension: Secondary | ICD-10-CM

## 2019-09-13 DIAGNOSIS — Z8639 Personal history of other endocrine, nutritional and metabolic disease: Secondary | ICD-10-CM | POA: Insufficient documentation

## 2019-09-13 DIAGNOSIS — M818 Other osteoporosis without current pathological fracture: Secondary | ICD-10-CM | POA: Insufficient documentation

## 2019-09-13 NOTE — Assessment & Plan Note (Signed)
Carotid duplex today shows stable velocities on the lower end of the 60 to 79% range on the right and a patent left carotid endarterectomy with no significant residual stenosis. She will continue her aspirin and Plavix.  It has been a year since we checked this without much change.  We can follow this as frequently as every 6 months.  Were going to split the difference and do a 16-month checkup at her next visit.

## 2019-09-13 NOTE — Progress Notes (Signed)
MRN : [id number]  Sharon Barajas is a 86 y.o. ([birth year]) female who presents with chief complaint of  Chief Complaint  Patient presents with  . Follow-up    U/S follow up  .  History of Present Illness: Patient returns in follow-up of her carotid disease.  She is doing well other than having some dental issues and needing to have that taken care of next month.  No focal neurologic symptoms.  Carotid duplex today shows stable velocities on the lower end of the 60 to 79% range on the right and a patent left carotid endarterectomy with no significant residual stenosis.  Current Outpatient Medications  Medication Sig Dispense Refill  . amLODipine (NORVASC) 10 MG tablet Take 10 mg by mouth every morning.    Marland Kitchen aspirin 81 MG tablet Take 81 mg by mouth every morning.    . carvedilol (COREG) 3.125 MG tablet Take 3.125 mg by mouth 2 (two) times daily with a meal.    . clopidogrel (PLAVIX) 75 MG tablet TAKE 1 TABLET BY MOUTH ONCE DAILY. 30 tablet 1  . lisinopril (PRINIVIL,ZESTRIL) 20 MG tablet Take 20 mg by mouth at bedtime.    . nitroGLYCERIN (NITROSTAT) 0.4 MG SL tablet Place 0.4 mg under the tongue every 5 (five) minutes as needed for chest pain.    . pantoprazole (PROTONIX) 20 MG tablet Take 20 mg by mouth daily.    Marland Kitchen atorvastatin (LIPITOR) 10 MG tablet  (Patient not taking: Reported on 09/13/2019)    . Calcium Carbonate-Vitamin D 600-400 MG-UNIT tablet Take by mouth. (Patient not taking: Reported on 09/13/2019)    . ergocalciferol (VITAMIN D2) 50000 UNITS capsule Take 50,000 Units by mouth once a week. (Patient not taking: Reported on 09/13/2019)    . loperamide (IMODIUM A-D) 2 MG tablet Take 1 tablet (2 mg total) by mouth 4 (four) times daily as needed for diarrhea or loose stools. (Patient not taking: Reported on 09/13/2019) 30 tablet 0  . Multiple Vitamins-Minerals (ICAPS) CAPS Take 1 capsule by mouth 2 (two) times daily. (Patient not taking: Reported on 09/13/2019)    . ondansetron  (ZOFRAN ODT) 4 MG disintegrating tablet Take 1 tablet (4 mg total) by mouth every 8 (eight) hours as needed for nausea or vomiting. (Patient not taking: Reported on 09/13/2019) 20 tablet 0   No current facility-administered medications for this visit.    Past Medical History:  Diagnosis Date  . Carotid artery stenosis    bilateral  . 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  . Hypercholesteremia   . Hypertension     Past Surgical History:  Procedure Laterality Date  . ABDOMINAL HYSTERECTOMY    . CHOLECYSTECTOMY    . COLON SURGERY    . COLONOSCOPY WITH PROPOFOL N/A 02/23/2016   Procedure: COLONOSCOPY WITH PROPOFOL;  Surgeon: Lollie Sails, MD;  Location: [facility] ENDOSCOPY;  Service: Endoscopy;  Laterality: N/A;  . ENDARTERECTOMY Left 12/17/2014   Procedure: ENDARTERECTOMY CAROTID;  Surgeon: Algernon Huxley, MD;  Location: [facility] ORS;  Service: Vascular;  Laterality: Left;  . INNER EAR SURGERY Left    Removal of ear tumor.     Social History   Tobacco Use  . Smoking status: Never Smoker  . Smokeless tobacco: Never Used  Substance Use Topics  . Alcohol use: No  . Drug use: No      Family History No bleeding or clotting disorders   No Known Allergies  REVIEW OF SYSTEMS (Negative unless checked)  Constitutional: [] Weight loss  [] Fever  [] Chills Cardiac: [] Chest pain   [] Chest pressure   [] Palpitations   [] Shortness of breath when laying flat   [] Shortness of breath at rest   [] Shortness of breath with exertion. Vascular:  [] Pain in legs with walking   [] Pain in legs at rest   [] Pain in legs when laying flat   [] Claudication   [] Pain in feet when walking  [] Pain in feet at rest  [] Pain in feet when laying flat   [] History of DVT   [] Phlebitis   [] Swelling in legs   [] Varicose veins   [] Non-healing ulcers Pulmonary:   [] Uses home oxygen   [] Productive cough   [] Hemoptysis   [] Wheeze  [] COPD   [] Asthma Neurologic:  [] Dizziness  [] Blackouts    [] Seizures   [] History of stroke   [] History of TIA  [] Aphasia   [] Temporary blindness   [] Dysphagia   [] Weakness or numbness in arms   [] Weakness or numbness in legs Musculoskeletal:  [x] Arthritis   [] Joint swelling   [x] Joint pain   [] Low back pain Hematologic:  [] Easy bruising  [] Easy bleeding   [] Hypercoagulable state   [] Anemic  [] Hepatitis Gastrointestinal:  [] Blood in stool   [] Vomiting blood  [] Gastroesophageal reflux/heartburn   [] Difficulty swallowing. Genitourinary:  [x] Chronic kidney disease   [] Difficult urination  [] Frequent urination  [] Burning with urination   [] Blood in urine Skin:  [] Rashes   [] Ulcers   [] Wounds Psychological:  [] History of anxiety   []  History of major depression.  Physical Examination  Vitals:   09/13/19 0907  BP: (!) 189/72  Pulse: 83  Weight: 110 lb (49.9 kg)  Height: 4\' 11"  (1.499 m)   Body mass index is 22.22 kg/m. Gen:  WD/WN, NAD.  Appears younger than stated age Head: War/AT, No temporalis wasting. Ear/Nose/Throat: Hearing grossly intact, nares w/o erythema or drainage, trachea midline Eyes: Conjunctiva clear. Sclera non-icteric Neck: Supple.  Soft right carotid bruit  Pulmonary:  Good air movement, equal and clear to auscultation bilaterally.  Cardiac: RRR, No JVD Vascular:  Vessel Right Left  Radial Palpable Palpable           Musculoskeletal: M/S 5/5 throughout.  No deformity or atrophy.  No lower extremity edema. Neurologic: CN 2-12 intact. Sensation grossly intact in extremities.  Symmetrical.  Speech is fluent. Motor exam as listed above. Psychiatric: Judgment intact, Mood & affect appropriate for pt's clinical situation. Dermatologic: No rashes or ulcers noted.  No cellulitis or open wounds.      CBC Lab Results  Component Value Date   WBC 6.6 05/01/2018   HGB 14.9 05/01/2018   HCT 47.9 (H) 05/01/2018   MCV 96.8 05/01/2018   PLT 165 05/01/2018    BMET    Component Value Date/Time   NA 140 05/01/2018 0435   NA 144  09/26/2012 1340   K 3.5 05/01/2018 0435   K 3.4 (L) 09/26/2012 1340   CL 109 05/01/2018 0435   CL 111 (H) 09/26/2012 1340   CO2 23 05/01/2018 0435   CO2 26 09/26/2012 1340   GLUCOSE 99 05/01/2018 0435   GLUCOSE 116 (H) 09/26/2012 1340   BUN 33 (H) 05/01/2018 0435   BUN 21 (H) 09/26/2012 1340   CREATININE 1.51 (H) 05/01/2018 0435   CREATININE 1.31 (H) 09/26/2012 1340   CALCIUM 8.8 (L) 05/01/2018 0435   CALCIUM 9.1 09/26/2012 1340   GFRNONAA 31 (L) 05/01/2018 0435   GFRNONAA 39 (L) 09/26/2012 1340  GFRAA 36 (L) 05/01/2018 0435   GFRAA 45 (L) 09/26/2012 1340   CrCl cannot be calculated (Patient's most recent lab result is older than the maximum 21 days allowed.).  COAG Lab Results  Component Value Date   INR 1.11 04/29/2018    Radiology No results found.   Assessment/Plan Hyperlipidemia lipid control important in reducing the progression of atherosclerotic disease.   Essential hypertension blood pressure control important in reducing the progression of atherosclerotic disease. On appropriate oral medications.  Carotid stenosis Carotid duplex today shows stable velocities on the lower end of the 60 to 79% range on the right and a patent left carotid endarterectomy with no significant residual stenosis. She will continue her aspirin and Plavix.  It has been a year since we checked this without much change.  We can follow this as frequently as every 6 months.  Were going to split the difference and do a 62-month checkup at her next visit.    Leotis Pain, MD  09/13/2019 9:40 AM    This note was created with Dragon medical transcription system.  Any errors from dictation are purely unintentional

## 2019-11-26 DIAGNOSIS — E782 Mixed hyperlipidemia: Secondary | ICD-10-CM | POA: Diagnosis not present

## 2019-11-27 DIAGNOSIS — R0600 Dyspnea, unspecified: Secondary | ICD-10-CM | POA: Diagnosis not present

## 2019-12-03 DIAGNOSIS — E782 Mixed hyperlipidemia: Secondary | ICD-10-CM | POA: Diagnosis not present

## 2019-12-03 DIAGNOSIS — I129 Hypertensive chronic kidney disease with stage 1 through stage 4 chronic kidney disease, or unspecified chronic kidney disease: Secondary | ICD-10-CM | POA: Diagnosis not present

## 2019-12-03 DIAGNOSIS — N1832 Chronic kidney disease, stage 3b: Secondary | ICD-10-CM | POA: Diagnosis not present

## 2019-12-03 DIAGNOSIS — R197 Diarrhea, unspecified: Secondary | ICD-10-CM | POA: Diagnosis not present

## 2019-12-05 DIAGNOSIS — R197 Diarrhea, unspecified: Secondary | ICD-10-CM | POA: Diagnosis not present

## 2020-02-17 DIAGNOSIS — N1832 Chronic kidney disease, stage 3b: Secondary | ICD-10-CM | POA: Diagnosis not present

## 2020-02-17 DIAGNOSIS — I10 Essential (primary) hypertension: Secondary | ICD-10-CM | POA: Diagnosis not present

## 2020-02-17 DIAGNOSIS — I6523 Occlusion and stenosis of bilateral carotid arteries: Secondary | ICD-10-CM | POA: Diagnosis not present

## 2020-02-17 DIAGNOSIS — E782 Mixed hyperlipidemia: Secondary | ICD-10-CM | POA: Diagnosis not present

## 2020-04-02 ENCOUNTER — Other Ambulatory Visit (INDEPENDENT_AMBULATORY_CARE_PROVIDER_SITE_OTHER): Payer: Self-pay | Admitting: Vascular Surgery

## 2020-04-06 DIAGNOSIS — N1831 Chronic kidney disease, stage 3a: Secondary | ICD-10-CM | POA: Diagnosis not present

## 2020-04-06 DIAGNOSIS — E782 Mixed hyperlipidemia: Secondary | ICD-10-CM | POA: Diagnosis not present

## 2020-04-06 DIAGNOSIS — I129 Hypertensive chronic kidney disease with stage 1 through stage 4 chronic kidney disease, or unspecified chronic kidney disease: Secondary | ICD-10-CM | POA: Diagnosis not present

## 2020-04-06 DIAGNOSIS — D692 Other nonthrombocytopenic purpura: Secondary | ICD-10-CM | POA: Diagnosis not present

## 2020-06-05 ENCOUNTER — Other Ambulatory Visit (INDEPENDENT_AMBULATORY_CARE_PROVIDER_SITE_OTHER): Payer: Self-pay | Admitting: Vascular Surgery

## 2020-06-12 ENCOUNTER — Other Ambulatory Visit: Payer: Self-pay

## 2020-06-12 ENCOUNTER — Encounter (INDEPENDENT_AMBULATORY_CARE_PROVIDER_SITE_OTHER): Payer: Self-pay | Admitting: Nurse Practitioner

## 2020-06-12 ENCOUNTER — Ambulatory Visit (INDEPENDENT_AMBULATORY_CARE_PROVIDER_SITE_OTHER): Payer: Medicare HMO | Admitting: Nurse Practitioner

## 2020-06-12 ENCOUNTER — Ambulatory Visit (INDEPENDENT_AMBULATORY_CARE_PROVIDER_SITE_OTHER): Payer: Medicare HMO

## 2020-06-12 VITALS — BP 191/64 | HR 82 | Ht 59.0 in | Wt 111.0 lb

## 2020-06-12 DIAGNOSIS — I6523 Occlusion and stenosis of bilateral carotid arteries: Secondary | ICD-10-CM | POA: Diagnosis not present

## 2020-06-12 DIAGNOSIS — I10 Essential (primary) hypertension: Secondary | ICD-10-CM | POA: Diagnosis not present

## 2020-06-12 DIAGNOSIS — E785 Hyperlipidemia, unspecified: Secondary | ICD-10-CM | POA: Diagnosis not present

## 2020-06-12 NOTE — Progress Notes (Signed)
Subjective:    Patient ID: Sharon Barajas, female    DOB: 08/24/1931, 87 y.o.   MRN: [id number] Chief Complaint  Patient presents with  . Follow-up  . Carotid    9 Mos. U/S     The patient is seen for follow up evaluation of carotid stenosis. The carotid stenosis followed by ultrasound.  The patient had a left carotid endarterectomy on 12/17/2014.  The patient denies amaurosis fugax. There is no recent history of TIA symptoms or focal motor deficits. There is no prior documented CVA.  The patient is taking enteric-coated aspirin 81 mg daily.  There is no history of migraine headaches. There is no history of seizures.  The patient has a history of coronary artery disease, no recent episodes of angina or shortness of breath. The patient denies PAD or claudication symptoms. There is a history of hyperlipidemia which is being treated with a statin.    Duplex ultrasound shows 60 to 79% right ICA with a 1 to 39% stenosis of the left ICA.    Review of Systems  Psychiatric/Behavioral: The patient is nervous/anxious.   All other systems reviewed and are negative.      Objective:   Physical Exam Vitals reviewed.  HENT:     Head: Normocephalic.  Neck:     Vascular: No carotid bruit.  Cardiovascular:     Rate and Rhythm: Normal rate.     Pulses: Normal pulses.  Pulmonary:     Effort: Pulmonary effort is normal.  Neurological:     Mental Status: She is alert and oriented to person, place, and time.  Psychiatric:        Mood and Affect: Mood normal.        Behavior: Behavior normal.        Thought Content: Thought content normal.        Judgment: Judgment normal.     BP (!) 191/64   Pulse 82   Ht 4\' 11"  (1.499 m)   Wt 111 lb (50.3 kg)   BMI 22.42 kg/m   Past Medical History:  Diagnosis Date  . Carotid artery stenosis    bilateral  . 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  . Hypercholesteremia   . Hypertension      Social History   Socioeconomic History  . Marital status: Widowed    Spouse name: Not on file  . Number of children: Not on file  . Years of education: Not on file  . Highest education level: Not on file  Occupational History  . Not on file  Tobacco Use  . Smoking status: Never Smoker  . Smokeless tobacco: Never Used  Substance and Sexual Activity  . Alcohol use: No  . Drug use: No  . Sexual activity: Yes    Birth control/protection: Surgical  Other Topics Concern  . Not on file  Social History Narrative  . Not on file   Social Determinants of Health   Financial Resource Strain: Not on file  Food Insecurity: Not on file  Transportation Needs: Not on file  Physical Activity: Not on file  Stress: Not on file  Social Connections: Not on file  Intimate Partner Violence: Not on file    Past Surgical History:  Procedure Laterality Date  . ABDOMINAL HYSTERECTOMY    . CHOLECYSTECTOMY    . COLON SURGERY    . COLONOSCOPY WITH PROPOFOL N/A 02/23/2016   Procedure: COLONOSCOPY WITH  PROPOFOL;  Surgeon: Lollie Sails, MD;  Location: [facility] ENDOSCOPY;  Service: Endoscopy;  Laterality: N/A;  . ENDARTERECTOMY Left 12/17/2014   Procedure: ENDARTERECTOMY CAROTID;  Surgeon: Algernon Huxley, MD;  Location: [facility] ORS;  Service: Vascular;  Laterality: Left;  . INNER EAR SURGERY Left    Removal of ear tumor.    History reviewed. No pertinent family history.  No Known Allergies  CBC Latest Ref Rng & Units 05/01/2018 04/30/2018 04/29/2018  WBC 4.0 - 10.5 K/uL 6.6 14.2(H) 21.8(H)  Hemoglobin 12.0 - 15.0 g/dL 14.9 10.1(L) 11.9(L)  Hematocrit 36.0 - 46.0 % 47.9(H) 32.0(L) 37.3  Platelets 150 - 400 K/uL 165 204 290      CMP     Component Value Date/Time   NA 140 05/01/2018 0435   NA 144 09/26/2012 1340   K 3.5 05/01/2018 0435   K 3.4 (L) 09/26/2012 1340   CL 109 05/01/2018 0435   CL 111 (H) 09/26/2012 1340   CO2 23 05/01/2018 0435   CO2 26 09/26/2012 1340   GLUCOSE 99  05/01/2018 0435   GLUCOSE 116 (H) 09/26/2012 1340   BUN 33 (H) 05/01/2018 0435   BUN 21 (H) 09/26/2012 1340   CREATININE 1.51 (H) 05/01/2018 0435   CREATININE 1.31 (H) 09/26/2012 1340   CALCIUM 8.8 (L) 05/01/2018 0435   CALCIUM 9.1 09/26/2012 1340   PROT 7.1 04/29/2018 1649   ALBUMIN 3.7 04/29/2018 1649   AST 22 04/29/2018 1649   ALT 18 04/29/2018 1649   ALKPHOS 86 04/29/2018 1649   BILITOT 1.5 (H) 04/29/2018 1649   GFRNONAA 31 (L) 05/01/2018 0435   GFRNONAA 39 (L) 09/26/2012 1340   GFRAA 36 (L) 05/01/2018 0435   GFRAA 45 (L) 09/26/2012 1340     No results found.     Assessment & Plan:   1. Bilateral carotid artery stenosis Recommend:  Given the patient's asymptomatic subcritical stenosis no further invasive testing or surgery at this time.  Duplex ultrasound shows 60 to 79% right ICA with a 1 to 39% stenosis of the left ICA.  Continue antiplatelet therapy as prescribed Continue management of CAD, HTN and Hyperlipidemia Healthy heart diet,  encouraged exercise at least 4 times per week The patient's progression has been slow, we will continue to follow up in 9 months with duplex ultrasound and physical exam   2. Essential hypertension Continue antihypertensive medications as already ordered, these medications have been reviewed and there are no changes at this time.   3. Hyperlipidemia, unspecified hyperlipidemia type Continue statin as ordered and reviewed, no changes at this time    Current Outpatient Medications on File Prior to Visit  Medication Sig Dispense Refill  . acetaminophen (TYLENOL) 500 MG tablet Take by mouth.    Marland Kitchen amLODipine (NORVASC) 10 MG tablet Take 10 mg by mouth every morning.    Marland Kitchen aspirin 81 MG tablet Take 81 mg by mouth every morning.    Marland Kitchen atorvastatin (LIPITOR) 10 MG tablet     . Calcium Carbonate-Vitamin D 600-400 MG-UNIT tablet Take by mouth.    . carvedilol (COREG) 3.125 MG tablet Take 3.125 mg by mouth 2 (two) times daily with a meal.     . clopidogrel (PLAVIX) 75 MG tablet TAKE 1 TABLET BY MOUTH ONCE DAILY. 30 tablet 11  . ergocalciferol (VITAMIN D2) 50000 UNITS capsule Take 50,000 Units by mouth once a week.    . fluticasone (FLONASE) 50 MCG/ACT nasal spray Place into the nose.    Marland Kitchen lisinopril (PRINIVIL,ZESTRIL)  20 MG tablet Take 20 mg by mouth at bedtime.    Marland Kitchen lisinopril (ZESTRIL) 40 MG tablet     . loperamide (IMODIUM A-D) 2 MG tablet Take 1 tablet (2 mg total) by mouth 4 (four) times daily as needed for diarrhea or loose stools. 30 tablet 0  . Multiple Vitamins-Minerals (ICAPS) CAPS Take 1 capsule by mouth 2 (two) times daily.    . nitroGLYCERIN (NITROSTAT) 0.4 MG SL tablet Place 0.4 mg under the tongue every 5 (five) minutes as needed for chest pain.    Marland Kitchen ondansetron (ZOFRAN ODT) 4 MG disintegrating tablet Take 1 tablet (4 mg total) by mouth every 8 (eight) hours as needed for nausea or vomiting. 20 tablet 0  . pantoprazole (PROTONIX) 20 MG tablet Take 20 mg by mouth daily.     No current facility-administered medications on file prior to visit.    There are no Patient Instructions on file for this visit. No follow-ups on file.   Kris Hartmann, NP

## 2020-08-11 DIAGNOSIS — I6523 Occlusion and stenosis of bilateral carotid arteries: Secondary | ICD-10-CM | POA: Diagnosis not present

## 2020-08-11 DIAGNOSIS — N1832 Chronic kidney disease, stage 3b: Secondary | ICD-10-CM | POA: Diagnosis not present

## 2020-08-11 DIAGNOSIS — I10 Essential (primary) hypertension: Secondary | ICD-10-CM | POA: Diagnosis not present

## 2020-08-11 DIAGNOSIS — E782 Mixed hyperlipidemia: Secondary | ICD-10-CM | POA: Diagnosis not present

## 2020-08-18 DIAGNOSIS — F411 Generalized anxiety disorder: Secondary | ICD-10-CM | POA: Diagnosis not present

## 2020-08-18 DIAGNOSIS — Z0000 Encounter for general adult medical examination without abnormal findings: Secondary | ICD-10-CM | POA: Diagnosis not present

## 2020-08-18 DIAGNOSIS — I10 Essential (primary) hypertension: Secondary | ICD-10-CM | POA: Diagnosis not present

## 2020-08-18 DIAGNOSIS — E785 Hyperlipidemia, unspecified: Secondary | ICD-10-CM | POA: Diagnosis not present

## 2020-09-10 DIAGNOSIS — E782 Mixed hyperlipidemia: Secondary | ICD-10-CM | POA: Diagnosis not present

## 2020-09-10 DIAGNOSIS — I6523 Occlusion and stenosis of bilateral carotid arteries: Secondary | ICD-10-CM | POA: Diagnosis not present

## 2020-09-10 DIAGNOSIS — I10 Essential (primary) hypertension: Secondary | ICD-10-CM | POA: Diagnosis not present

## 2020-09-10 DIAGNOSIS — N1831 Chronic kidney disease, stage 3a: Secondary | ICD-10-CM | POA: Diagnosis not present

## 2020-09-29 DIAGNOSIS — I129 Hypertensive chronic kidney disease with stage 1 through stage 4 chronic kidney disease, or unspecified chronic kidney disease: Secondary | ICD-10-CM | POA: Diagnosis not present

## 2020-09-29 DIAGNOSIS — F411 Generalized anxiety disorder: Secondary | ICD-10-CM | POA: Diagnosis not present

## 2020-09-29 DIAGNOSIS — N1832 Chronic kidney disease, stage 3b: Secondary | ICD-10-CM | POA: Diagnosis not present

## 2020-09-29 DIAGNOSIS — E782 Mixed hyperlipidemia: Secondary | ICD-10-CM | POA: Diagnosis not present

## 2020-11-25 DIAGNOSIS — R0609 Other forms of dyspnea: Secondary | ICD-10-CM | POA: Diagnosis not present

## 2020-11-29 IMAGING — CT CT CHEST W/O CM
2 of 3 series · 15 of 36 positions shown, 18 images · non-contrast
Comparison: 04/29/2018 chest radiograph. No prior chest CT.

CLINICAL DATA: Cough. Shortness of breath with pneumonia.

EXAM:
CT CHEST WITHOUT CONTRAST
TECHNIQUE: Multidetector CT imaging of the chest was performed following the
standard protocol without IV contrast.

[Series 2: thorax · axial · 0.48mm/px · z∈[-458,-248]mm · 12 of 125 slices shown, 15 images]
[im 10/125  mediastinal]
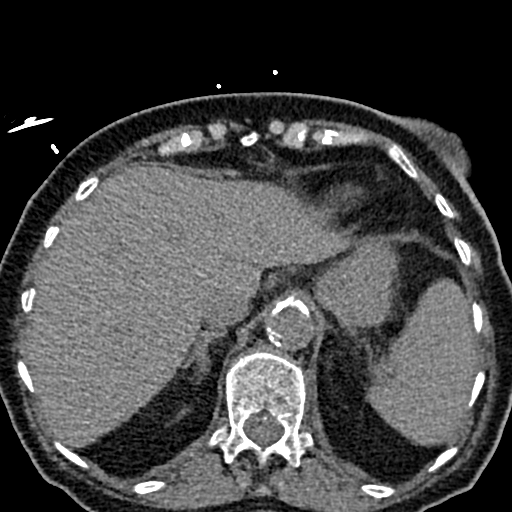
[im 10/125  lung]
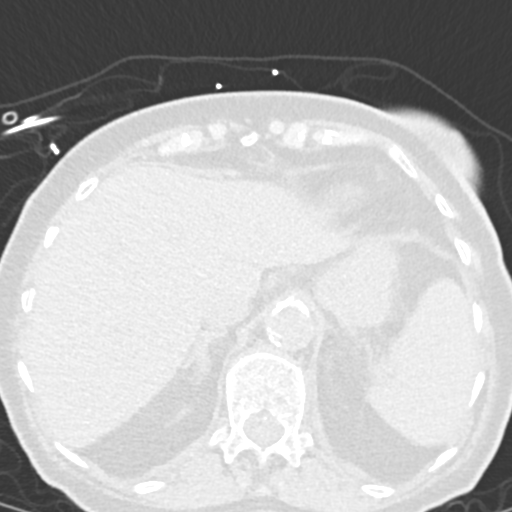
[im 19/125  lung]
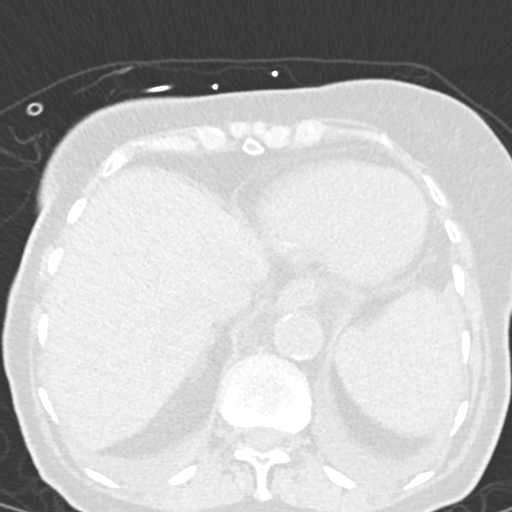
[im 28/125  lung]
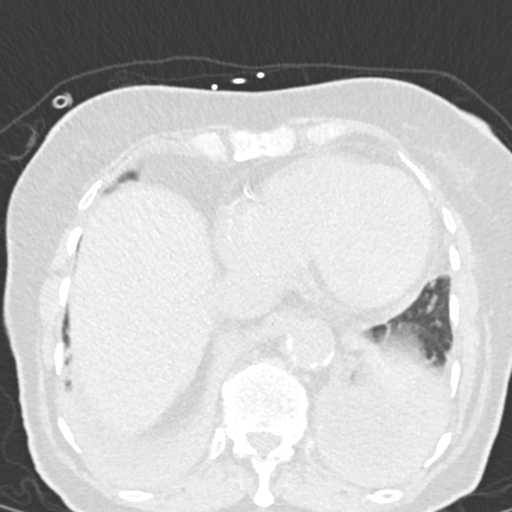
[im 37/125  lung]
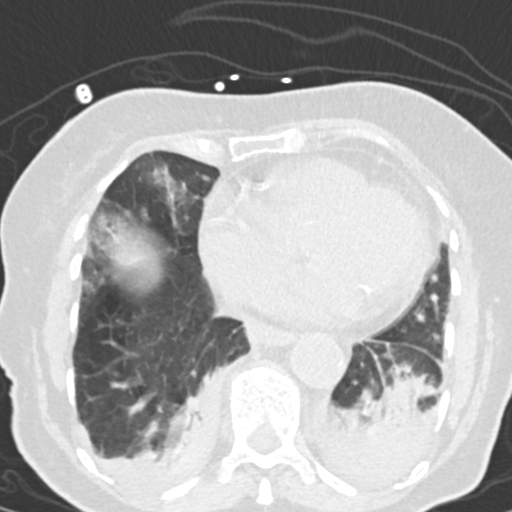
[im 46/125  mediastinal]
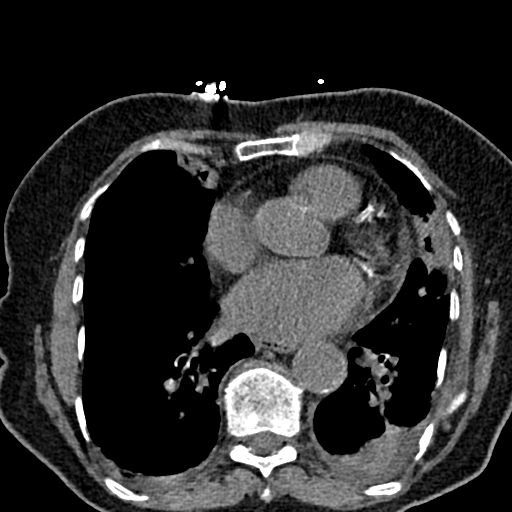
[im 46/125  lung]
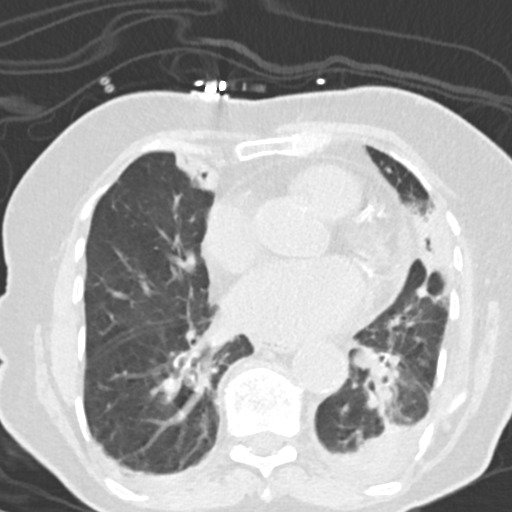
[im 56/125  lung]
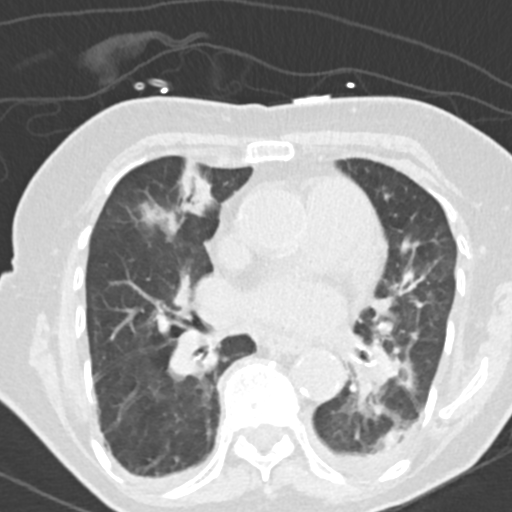
[im 69/125  lung]
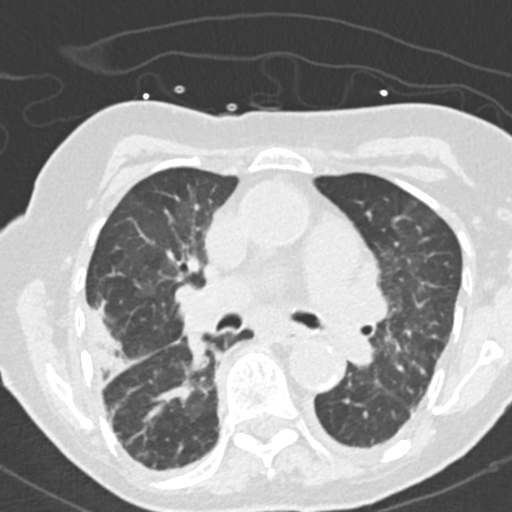
[im 79/125  lung]
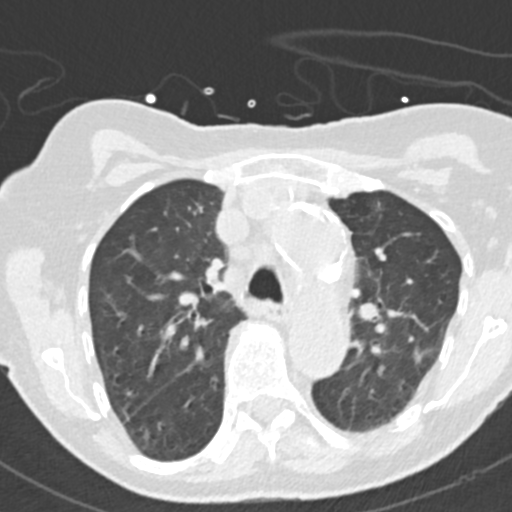
[im 88/125  mediastinal]
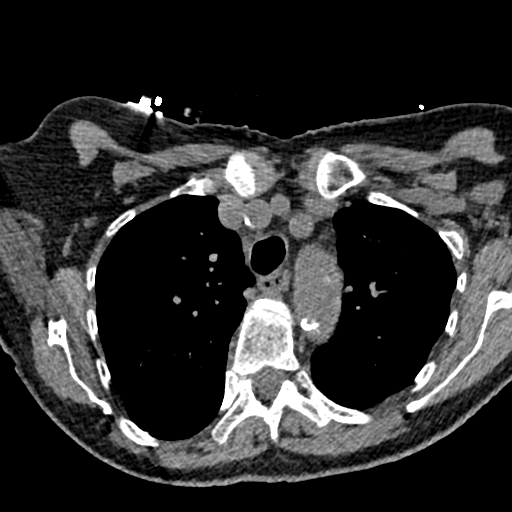
[im 88/125  lung]
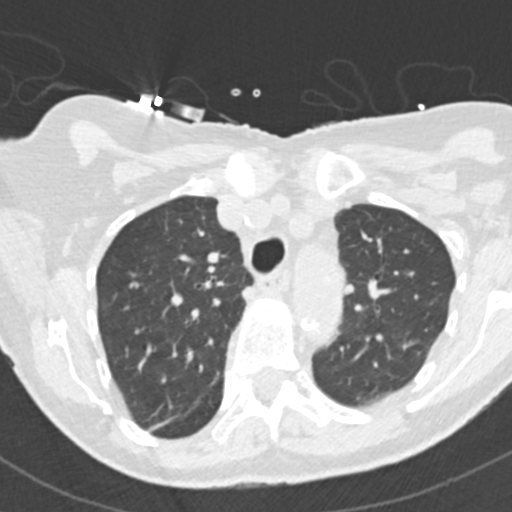
[im 97/125  lung]
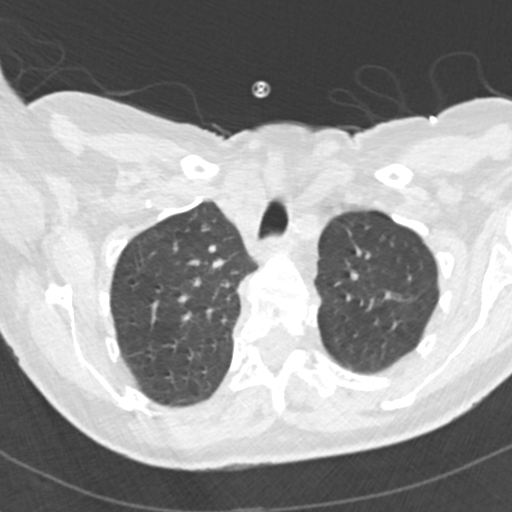
[im 106/125  lung]
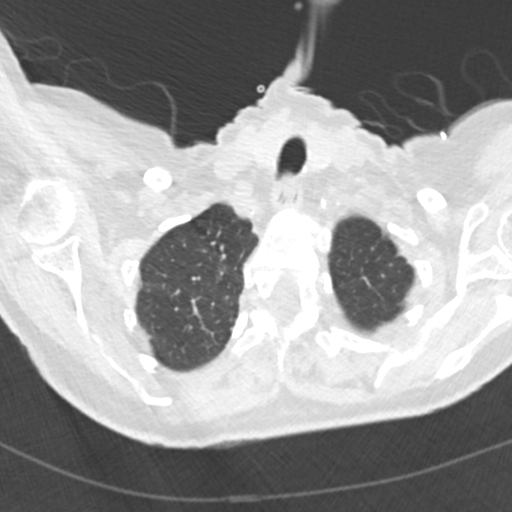
[im 115/125  lung]
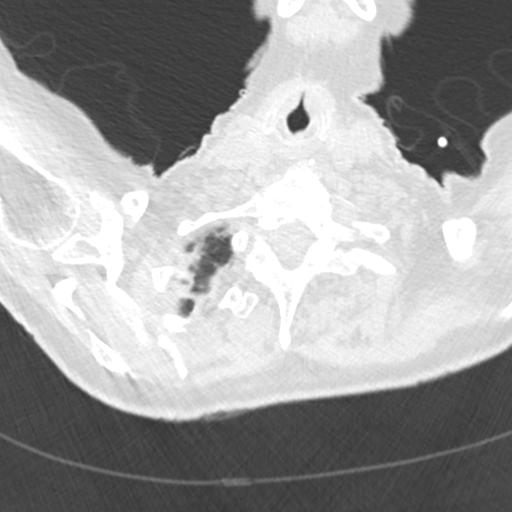

[Series 5: coronal · coronal · 0.54mm/px · 3 of 111 slices shown]
[im 23/111  lung]
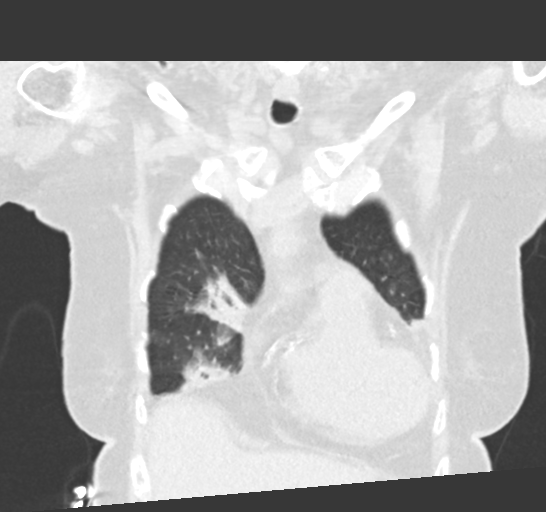
[im 45/111  lung]
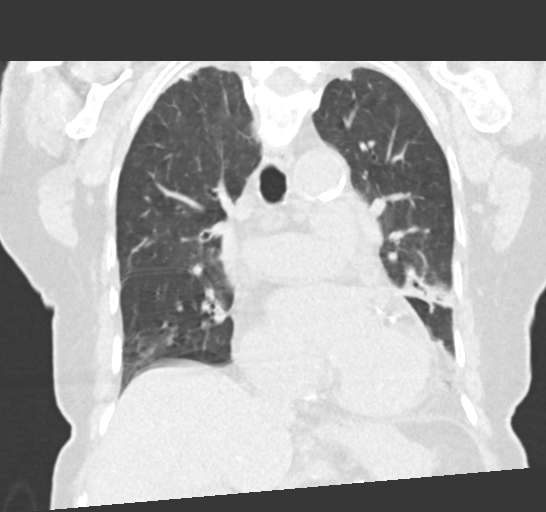
[im 67/111  lung]
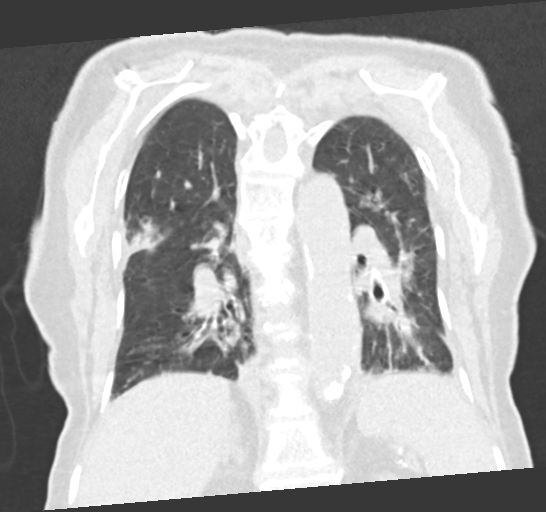

[15 of 36 positions shown; findings below may reference images not displayed]

FINDINGS: Cardiovascular: Aortic and branch vessel atherosclerosis. Tortuous
thoracic aorta. Moderate cardiomegaly, without pericardial effusion.
Multivessel coronary artery atherosclerosis. Pulmonary artery
enlargement, outflow tract 3.2 cm. Mild motion degradation in the
mid and lower chest.

Mediastinum/Nodes: Precarinal node of 1.9 cm on image 54/2. Hilar
regions poorly evaluated without intravenous contrast.

Lungs/Pleura: Tiny bilateral pleural effusions.

Patent airways.

Bilateral areas of patchy airspace consolidation with air
bronchograms. Areas in the left upper lobe have a somewhat more
nodular appearance, including at 7 mm on image 43/3.

Upper Abdomen: Normal imaged portions of the liver, spleen, stomach,
kidneys. Mild bilateral adrenal thickening.

Musculoskeletal: Accentuation of expected thoracic kyphosis
IMPRESSION: 1. Bilateral airspace consolidation, most consistent with pneumonia.
Differential considerations include inflammatory etiologies such as
organizing pneumonia. Some areas have a more nodular appearance,
also favored to be infectious. Consider follow-up with chest CT at
3-6 months to confirm resolution.
2. Small bilateral pleural effusions.
3. Pulmonary artery enlargement suggests pulmonary arterial
hypertension.
4. Coronary artery atherosclerosis.
5. Mild thoracic adenopathy, favored to be reactive. Recommend
attention on follow-up.
6. Mild motion degradation.

Aortic Atherosclerosis ([id number]).

## 2020-12-28 DIAGNOSIS — E782 Mixed hyperlipidemia: Secondary | ICD-10-CM | POA: Diagnosis not present

## 2021-01-05 DIAGNOSIS — R195 Other fecal abnormalities: Secondary | ICD-10-CM | POA: Diagnosis not present

## 2021-01-05 DIAGNOSIS — I129 Hypertensive chronic kidney disease with stage 1 through stage 4 chronic kidney disease, or unspecified chronic kidney disease: Secondary | ICD-10-CM | POA: Diagnosis not present

## 2021-01-05 DIAGNOSIS — Z8639 Personal history of other endocrine, nutritional and metabolic disease: Secondary | ICD-10-CM | POA: Diagnosis not present

## 2021-01-05 DIAGNOSIS — E782 Mixed hyperlipidemia: Secondary | ICD-10-CM | POA: Diagnosis not present

## 2021-01-05 DIAGNOSIS — E559 Vitamin D deficiency, unspecified: Secondary | ICD-10-CM | POA: Diagnosis not present

## 2021-01-05 DIAGNOSIS — N1832 Chronic kidney disease, stage 3b: Secondary | ICD-10-CM | POA: Diagnosis not present

## 2021-01-14 DIAGNOSIS — E782 Mixed hyperlipidemia: Secondary | ICD-10-CM | POA: Diagnosis not present

## 2021-01-14 DIAGNOSIS — I6523 Occlusion and stenosis of bilateral carotid arteries: Secondary | ICD-10-CM | POA: Diagnosis not present

## 2021-01-14 DIAGNOSIS — N1832 Chronic kidney disease, stage 3b: Secondary | ICD-10-CM | POA: Diagnosis not present

## 2021-01-14 DIAGNOSIS — I10 Essential (primary) hypertension: Secondary | ICD-10-CM | POA: Diagnosis not present

## 2021-03-12 ENCOUNTER — Encounter (INDEPENDENT_AMBULATORY_CARE_PROVIDER_SITE_OTHER): Payer: Medicare HMO

## 2021-03-12 ENCOUNTER — Ambulatory Visit (INDEPENDENT_AMBULATORY_CARE_PROVIDER_SITE_OTHER): Payer: Medicare HMO | Admitting: Vascular Surgery

## 2021-03-17 ENCOUNTER — Other Ambulatory Visit: Payer: Self-pay

## 2021-03-17 ENCOUNTER — Encounter: Payer: Self-pay | Admitting: *Deleted

## 2021-03-17 ENCOUNTER — Emergency Department
Admission: EM | Admit: 2021-03-17 | Discharge: 2021-03-17 | Disposition: A | Discharge status: Home / Self Care | Payer: Medicare HMO | Attending: Emergency Medicine | Admitting: Emergency Medicine

## 2021-03-17 DIAGNOSIS — Z8679 Personal history of other diseases of the circulatory system: Secondary | ICD-10-CM | POA: Diagnosis not present

## 2021-03-17 DIAGNOSIS — I129 Hypertensive chronic kidney disease with stage 1 through stage 4 chronic kidney disease, or unspecified chronic kidney disease: Secondary | ICD-10-CM | POA: Diagnosis not present

## 2021-03-17 DIAGNOSIS — R002 Palpitations: Secondary | ICD-10-CM | POA: Diagnosis not present

## 2021-03-17 DIAGNOSIS — R109 Unspecified abdominal pain: Secondary | ICD-10-CM | POA: Insufficient documentation

## 2021-03-17 DIAGNOSIS — N183 Chronic kidney disease, stage 3 unspecified: Secondary | ICD-10-CM | POA: Insufficient documentation

## 2021-03-17 DIAGNOSIS — Z79899 Other long term (current) drug therapy: Secondary | ICD-10-CM | POA: Insufficient documentation

## 2021-03-17 LAB — COMPREHENSIVE METABOLIC PANEL
ALT: 17 U/L (ref 0–44)
AST: 28 U/L (ref 15–41)
Albumin: 4.2 g/dL (ref 3.5–5.0)
Alkaline Phosphatase: 96 U/L (ref 38–126)
Anion gap: 7 (ref 5–15)
BUN: 20 mg/dL (ref 8–23)
CO2: 25 mmol/L (ref 22–32)
Calcium: 9.1 mg/dL (ref 8.9–10.3)
Chloride: 109 mmol/L (ref 98–111)
Creatinine, Ser: 1.06 mg/dL — ABNORMAL HIGH (ref 0.44–1.00)
GFR, Estimated: 51 mL/min — ABNORMAL LOW (ref >60)
Glucose, Bld: 128 mg/dL — ABNORMAL HIGH (ref 70–99)
Potassium: 4.3 mmol/L (ref 3.5–5.1)
Sodium: 141 mmol/L (ref 135–145)
Total Bilirubin: 0.9 mg/dL (ref 0.3–1.2)
Total Protein: 7 g/dL (ref 6.5–8.1)

## 2021-03-17 LAB — CBC
HCT: 42.1 % (ref 36.0–46.0)
Hemoglobin: 13.3 g/dL (ref 12.0–15.0)
MCH: 30.5 pg (ref 26.0–34.0)
MCHC: 31.6 g/dL (ref 30.0–36.0)
MCV: 96.6 fL (ref 80.0–100.0)
Platelets: 197 10*3/uL (ref 150–400)
RBC: 4.36 MIL/uL (ref 3.87–5.11)
RDW: 12.6 % (ref 11.5–15.5)
WBC: 6.6 10*3/uL (ref 4.0–10.5)
nRBC: 0 % (ref 0.0–0.2)

## 2021-03-17 LAB — TROPONIN I (HIGH SENSITIVITY)
Troponin I (High Sensitivity): 12 ng/L (ref <18)
Troponin I (High Sensitivity): 18 ng/L — ABNORMAL HIGH (ref <18)
Troponin I (High Sensitivity): 18 ng/L — ABNORMAL HIGH (ref <18)

## 2021-03-17 LAB — LIPASE, BLOOD: Lipase: 38 U/L (ref 11–51)

## 2021-03-17 NOTE — ED Notes (Signed)
D/c instructions discussed with family, denies questions or concerns.

## 2021-03-17 NOTE — ED Notes (Signed)
Presents to the ED with abd pain just under her breasts. Pt states that she was sitting watching tv when pain started. No complaints of pain at this time. A&Ox4. Skin p/w/d. RR even and nonlabored. Stated that she vomitted "about a spoonful."

## 2021-03-17 NOTE — Discharge Instructions (Signed)
Your cardiac enzymes were mildly elevated but stable.  It is possible that you had an arrhythmia which was causing your symptoms.  If you have recurrent symptoms or develop any new symptoms such as chest pain or shortness of breath, please return to the emergency department.

## 2021-03-17 NOTE — ED Provider Notes (Signed)
[facility]  ____________________________________________   Event Date/Time   First MD Initiated Contact with Patient 03/17/21 [phone]     (approximate)  I have reviewed the triage vital signs and the nursing notes.   HISTORY  Chief Complaint Abdominal Pain    HPI [name] is a 87 y.o. female with past medical history of carotid stenosis, hypertension, hyperlipidemia who presents with palpitations and abdominal pain.  Patient was sitting watching TV when she had sudden onset of discomfort in her abdomen as well as a sensation of her heart was racing in her throat.  She had no associated dyspnea did have an episode of vomiting.  Lasted for several minutes.  She called her daughter who brought her to the emergency department.  Currently she is asymptomatic.  She denies any ongoing palpitations or abdominal pain.  Has not had fevers chills dysuria diarrhea or constipation.  She has never had this before.         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   Hypercholesteremia    Hypertension     Patient Active Problem List   Diagnosis Date Noted   Gastroesophageal reflux disease without esophagitis 09/13/2019   History of vitamin D deficiency 09/13/2019   Other osteoporosis without current pathological fracture 09/13/2019   CKD (chronic kidney disease) stage 3, GFR 30-59 ml/min (HCC) 08/06/2018   Senile purpura ([place]) 08/06/2018   Community acquired pneumonia 04/29/2018   AKI (acute kidney injury) ([place]) 04/29/2018   Elevated troponin 04/29/2018   Pneumonia 04/29/2018   Essential hypertension 07/26/2017   Hyperlipidemia 07/26/2017   Encounter for general adult medical examination without abnormal findings 09/27/2016   Seasonal allergic rhinitis due to pollen 09/27/2016   Moderate mitral insufficiency 01/07/2016   Vaccine counseling  11/23/2015   Carotid stenosis 12/17/2014   Bilateral carotid artery stenosis 10/17/2014   H/O adenomatous polyp of colon 10/22/2013    Past Surgical History:  Procedure Laterality Date   ABDOMINAL HYSTERECTOMY     CHOLECYSTECTOMY     COLON SURGERY     COLONOSCOPY WITH PROPOFOL N/A 02/23/2016   Procedure: COLONOSCOPY WITH PROPOFOL;  Surgeon: Lollie Sails, MD;  Location: [facility] ENDOSCOPY;  Service: Endoscopy;  Laterality: N/A;   ENDARTERECTOMY Left 12/17/2014   Procedure: ENDARTERECTOMY CAROTID;  Surgeon: Algernon Huxley, MD;  Location: [facility] ORS;  Service: Vascular;  Laterality: Left;   INNER EAR SURGERY Left    Removal of ear tumor.    Prior to Admission medications   Medication Sig Start Date End Date Taking? Authorizing Provider  acetaminophen (TYLENOL) 500 MG tablet Take by mouth.    [provider]  amLODipine (NORVASC) 10 MG tablet Take 10 mg by mouth every morning.    [provider]  aspirin 81 MG tablet Take 81 mg by mouth every morning.    [provider]  atorvastatin (LIPITOR) 10 MG tablet  05/08/17   [provider]  Calcium Carbonate-Vitamin D 600-400 MG-UNIT tablet Take by mouth.    [provider]  carvedilol (COREG) 3.125 MG tablet Take 3.125 mg by mouth 2 (two) times daily with a meal.    [provider]  clopidogrel (PLAVIX) 75 MG tablet TAKE 1 TABLET BY MOUTH ONCE DAILY. 06/08/20   Algernon Huxley, MD  ergocalciferol (VITAMIN D2) 50000 UNITS capsule Take  50,000 Units by mouth once a week.    [provider]  fluticasone Asencion Islam) 50 MCG/ACT nasal spray Place into the nose. 08/23/18   [provider]  lisinopril (PRINIVIL,ZESTRIL) 20 MG tablet Take 20 mg by mouth at bedtime.    [provider]  lisinopril (ZESTRIL) 40 MG tablet  03/10/20   [provider]  loperamide (IMODIUM A-D) 2 MG tablet Take 1 tablet (2 mg total) by mouth 4 (four) times daily as needed for diarrhea or loose stools.  09/01/16   Earleen Newport, MD  Multiple Vitamins-Minerals (ICAPS) CAPS Take 1 capsule by mouth 2 (two) times daily.    [provider]  nitroGLYCERIN (NITROSTAT) 0.4 MG SL tablet Place 0.4 mg under the tongue every 5 (five) minutes as needed for chest pain.    [provider]  ondansetron (ZOFRAN ODT) 4 MG disintegrating tablet Take 1 tablet (4 mg total) by mouth every 8 (eight) hours as needed for nausea or vomiting. 09/01/16   Earleen Newport, MD  pantoprazole (PROTONIX) 20 MG tablet Take 20 mg by mouth daily.    [provider]    Allergies Patient has no known allergies.  No family history on file.  Social History Social History   Tobacco Use   Smoking status: Never   Smokeless tobacco: Never  Substance Use Topics   Alcohol use: No   Drug use: No    Review of Systems   Review of Systems  Constitutional:  Negative for appetite change, chills and fever.  Respiratory:  Negative for chest tightness and shortness of breath.   Cardiovascular:  Positive for palpitations. Negative for chest pain.  Gastrointestinal:  Positive for abdominal pain, nausea and vomiting.  Genitourinary:  Negative for dysuria.  All other systems reviewed and are negative.  Physical Exam Updated Vital Signs BP (!) 154/69    Pulse 81    Temp 97.7 F (36.5 C) (Oral)    Resp (!) 24    Ht 4\' 11"  (1.499 m)    Wt 50.3 kg    SpO2 96%    BMI 22.42 kg/m   Physical Exam Vitals and nursing note reviewed.  Constitutional:      General: She is not in acute distress.    Appearance: Normal appearance.  HENT:     Head: Normocephalic and atraumatic.  Eyes:     General: No scleral icterus.    Conjunctiva/sclera: Conjunctivae normal.  Cardiovascular:     Rate and Rhythm: Normal rate and regular rhythm.  Pulmonary:     Effort: Pulmonary effort is normal. No respiratory distress.     Breath sounds: No stridor.  Abdominal:     General: Abdomen is flat.     Palpations: Abdomen  is soft.     Tenderness: There is no abdominal tenderness.     Comments: Abdomen is soft and nontender to deep palpation throughout all quadrants  Musculoskeletal:        General: No deformity or signs of injury.     Cervical back: Normal range of motion.  Skin:    General: Skin is dry.     Coloration: Skin is not jaundiced or pale.  Neurological:     General: No focal deficit present.     Mental Status: She is alert and oriented to person, place, and time. Mental status is at baseline.  Psychiatric:        Mood and Affect: Mood normal.        Behavior:  Behavior normal.     LABS (all labs ordered are listed, but only abnormal results are displayed)  Labs Reviewed  COMPREHENSIVE METABOLIC PANEL - Abnormal; Notable for the following components:      Result Value   Glucose, Bld 128 (*)    Creatinine, Ser 1.06 (*)    GFR, Estimated 51 (*)    All other components within normal limits  TROPONIN I (HIGH SENSITIVITY) - Abnormal; Notable for the following components:   Troponin I (High Sensitivity) 18 (*)    All other components within normal limits  TROPONIN I (HIGH SENSITIVITY) - Abnormal; Notable for the following components:   Troponin I (High Sensitivity) 18 (*)    All other components within normal limits  LIPASE, BLOOD  CBC  URINALYSIS, ROUTINE W REFLEX MICROSCOPIC  TROPONIN I (HIGH SENSITIVITY)   ____________________________________________  EKG  Sinus tachycardia with occasional PVCs, no acute ischemic changes ____________________________________________  RADIOLOGY Almeta Monas, personally viewed and evaluated these images (plain radiographs) as part of my medical decision making, as well as reviewing the written report by the radiologist.  ED MD interpretation:      ____________________________________________   PROCEDURES  Procedure(s) performed (including Critical Care):  Procedures   ____________________________________________   INITIAL  IMPRESSION / [place] / ED COURSE     Patient is an 87 year old female who presents with an episode of palpitations and abdominal discomfort.  It started acutely while at rest.  She describes a discomfort in the epigastric region but also points to the lower abdomen.  This was associated with significant sensation of her throat.  There was some nausea with an episode of vomiting.  Her symptoms have since resolved and she is currently asymptomatic.  She denies any associated lightheadedness or dizziness.  No history of similar.  Her EKG shows sinus tach with occasional PVCs.  Her initial troponin is negative.  On exam she is very well-appearing.  Her abdomen is completely nontender throughout.  I have low suspicion for intra-abdominal process.  I am more concerned for potential cardiac event, including arrhythmia.  Less likely to be ACS but certainly on the low.  We will check another troponin.  If troponins are negative and she continues to be is asymptomatic I think it is reasonable to have her follow-up.  Patient's initial troponin is 12 and on repeat it is 18, this is of questionable significance but is technically elevated and with the story I am somewhat concerned that this was a cardiac event.  Discussed with the patient and her granddaughter and patient is adamant that she does not want to be hospitalized.  I agree that given patient is now asymptomatic hospitalization may not be necessary.  In order to further clarify if the troponin was on the way up I got a third troponin at 6 hours which is again 18, unchanged from the prior.  This with her reassuring EKG and no symptoms she has been in the ED I agree that she is stable for outpatient follow-up.  They plan on calling her cardiologist today and we discussed that if she has any recurrent symptoms she needs to come back to the emergency department.   ____________________________________________   FINAL CLINICAL IMPRESSION(S) / ED  DIAGNOSES  Final diagnoses:  Palpitations     ED Discharge Orders     None        Note:  This document was prepared using Dragon voice recognition software and may include unintentional dictation errors.  Rada Hay, MD 03/17/21 [phone]

## 2021-03-17 NOTE — ED Triage Notes (Signed)
Pt reports abd pain with vomiting tonight.  Pt states heart was racing.  No chest pain.  No sob.  Pt alert speech clear.

## 2021-03-31 DIAGNOSIS — E559 Vitamin D deficiency, unspecified: Secondary | ICD-10-CM | POA: Diagnosis not present

## 2021-03-31 DIAGNOSIS — E782 Mixed hyperlipidemia: Secondary | ICD-10-CM | POA: Diagnosis not present

## 2021-04-07 DIAGNOSIS — Z8639 Personal history of other endocrine, nutritional and metabolic disease: Secondary | ICD-10-CM | POA: Diagnosis not present

## 2021-04-07 DIAGNOSIS — E782 Mixed hyperlipidemia: Secondary | ICD-10-CM | POA: Diagnosis not present

## 2021-04-07 DIAGNOSIS — D692 Other nonthrombocytopenic purpura: Secondary | ICD-10-CM | POA: Diagnosis not present

## 2021-04-07 DIAGNOSIS — N1831 Chronic kidney disease, stage 3a: Secondary | ICD-10-CM | POA: Diagnosis not present

## 2021-04-07 DIAGNOSIS — I129 Hypertensive chronic kidney disease with stage 1 through stage 4 chronic kidney disease, or unspecified chronic kidney disease: Secondary | ICD-10-CM | POA: Diagnosis not present

## 2021-04-07 DIAGNOSIS — F411 Generalized anxiety disorder: Secondary | ICD-10-CM | POA: Diagnosis not present

## 2021-04-20 ENCOUNTER — Ambulatory Visit (INDEPENDENT_AMBULATORY_CARE_PROVIDER_SITE_OTHER): Payer: Medicare HMO

## 2021-04-20 ENCOUNTER — Encounter (INDEPENDENT_AMBULATORY_CARE_PROVIDER_SITE_OTHER): Payer: Self-pay | Admitting: Vascular Surgery

## 2021-04-20 ENCOUNTER — Other Ambulatory Visit (INDEPENDENT_AMBULATORY_CARE_PROVIDER_SITE_OTHER): Payer: Self-pay | Admitting: Vascular Surgery

## 2021-04-20 ENCOUNTER — Ambulatory Visit (INDEPENDENT_AMBULATORY_CARE_PROVIDER_SITE_OTHER): Payer: Medicare HMO | Admitting: Vascular Surgery

## 2021-04-20 ENCOUNTER — Other Ambulatory Visit: Payer: Self-pay

## 2021-04-20 VITALS — BP 184/66 | HR 84 | Resp 16 | Ht 59.0 in | Wt 107.0 lb

## 2021-04-20 DIAGNOSIS — E785 Hyperlipidemia, unspecified: Secondary | ICD-10-CM

## 2021-04-20 DIAGNOSIS — I6523 Occlusion and stenosis of bilateral carotid arteries: Secondary | ICD-10-CM

## 2021-04-20 DIAGNOSIS — I10 Essential (primary) hypertension: Secondary | ICD-10-CM

## 2021-04-20 NOTE — Progress Notes (Signed)
MRN : [id number]  Sharon Barajas is a 88 y.o. ([birth year]) female who presents with chief complaint of  Chief Complaint  Patient presents with   Follow-up    ultrasound  .  History of Present Illness: Patient returns today in follow up of her carotid disease.  She is about 6 to 7 years status post left carotid endarterectomy.  She is doing well today.  No complaints.  No focal neurologic symptoms since her last visit. Duplex today shows relatively stable stenosis in the 60 to 79% range on the right and a patent left carotid endarterectomy on the left.  Current Outpatient Medications  Medication Sig Dispense Refill   acetaminophen (TYLENOL) 500 MG tablet Take by mouth.     amLODipine (NORVASC) 10 MG tablet Take 10 mg by mouth every morning.     aspirin 81 MG tablet Take 81 mg by mouth every morning.     atorvastatin (LIPITOR) 10 MG tablet      Calcium Carbonate-Vitamin D 600-400 MG-UNIT tablet Take by mouth.     carvedilol (COREG) 3.125 MG tablet Take 3.125 mg by mouth 2 (two) times daily with a meal.     clopidogrel (PLAVIX) 75 MG tablet TAKE 1 TABLET BY MOUTH ONCE DAILY. 30 tablet 11   ergocalciferol (VITAMIN D2) 50000 UNITS capsule Take 50,000 Units by mouth once a week.     fluticasone (FLONASE) 50 MCG/ACT nasal spray Place into the nose.     lisinopril (PRINIVIL,ZESTRIL) 20 MG tablet Take 20 mg by mouth at bedtime.     lisinopril (ZESTRIL) 40 MG tablet      loperamide (IMODIUM A-D) 2 MG tablet Take 1 tablet (2 mg total) by mouth 4 (four) times daily as needed for diarrhea or loose stools. 30 tablet 0   Multiple Vitamins-Minerals (ICAPS) CAPS Take 1 capsule by mouth 2 (two) times daily.     nitroGLYCERIN (NITROSTAT) 0.4 MG SL tablet Place 0.4 mg under the tongue every 5 (five) minutes as needed for chest pain.     ondansetron (ZOFRAN ODT) 4 MG disintegrating tablet Take 1 tablet (4 mg total) by mouth every 8 (eight) hours as needed for nausea or vomiting. 20 tablet 0    pantoprazole (PROTONIX) 20 MG tablet Take 20 mg by mouth daily.     No current facility-administered medications for this visit.    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   Hypercholesteremia    Hypertension     Past Surgical History:  Procedure Laterality Date   ABDOMINAL HYSTERECTOMY     CHOLECYSTECTOMY     COLON SURGERY     COLONOSCOPY WITH PROPOFOL N/A 02/23/2016   Procedure: COLONOSCOPY WITH PROPOFOL;  Surgeon: Lollie Sails, MD;  Location: [facility] ENDOSCOPY;  Service: Endoscopy;  Laterality: N/A;   ENDARTERECTOMY Left 12/17/2014   Procedure: ENDARTERECTOMY CAROTID;  Surgeon: Algernon Huxley, MD;  Location: [facility] ORS;  Service: Vascular;  Laterality: Left;   INNER EAR SURGERY Left    Removal of ear tumor.     Social History   Tobacco Use   Smoking status: Never   Smokeless tobacco: Never  Substance Use Topics   Alcohol use: No   Drug use: No      No family history on file.   No Known Allergies  REVIEW OF SYSTEMS (Negative unless  checked)   Constitutional: [] Weight loss  [] Fever  [] Chills Cardiac: [] Chest pain   [] Chest pressure   [] Palpitations   [] Shortness of breath when laying flat   [] Shortness of breath at rest   [] Shortness of breath with exertion. Vascular:  [] Pain in legs with walking   [] Pain in legs at rest   [] Pain in legs when laying flat   [] Claudication   [] Pain in feet when walking  [] Pain in feet at rest  [] Pain in feet when laying flat   [] History of DVT   [] Phlebitis   [] Swelling in legs   [] Varicose veins   [] Non-healing ulcers Pulmonary:   [] Uses home oxygen   [] Productive cough   [] Hemoptysis   [] Wheeze  [] COPD   [] Asthma Neurologic:  [] Dizziness  [] Blackouts   [] Seizures   [] History of stroke   [] History of TIA  [] Aphasia   [] Temporary blindness   [] Dysphagia   [] Weakness or numbness in arms   [] Weakness or numbness in  legs Musculoskeletal:  [x] Arthritis   [] Joint swelling   [x] Joint pain   [] Low back pain Hematologic:  [] Easy bruising  [] Easy bleeding   [] Hypercoagulable state   [] Anemic  [] Hepatitis Gastrointestinal:  [] Blood in stool   [] Vomiting blood  [] Gastroesophageal reflux/heartburn   [] Difficulty swallowing. Genitourinary:  [x] Chronic kidney disease   [] Difficult urination  [] Frequent urination  [] Burning with urination   [] Blood in urine Skin:  [] Rashes   [] Ulcers   [] Wounds Psychological:  [] History of anxiety   []  History of major depression.  Physical Examination  BP (!) 184/66 (BP Location: Right Arm)    Pulse 84    Resp 16    Ht 4\' 11"  (1.499 m)    Wt 107 lb (48.5 kg)    BMI 21.61 kg/m  Gen:  WD/WN, NAD Head: [place]/AT, No temporalis wasting. Ear/Nose/Throat: Hearing grossly intact, nares w/o erythema or drainage Eyes: Conjunctiva clear. Sclera non-icteric Neck: Supple.  Trachea midline.  Right carotid bruit Pulmonary:  Good air movement, no use of accessory muscles.  Cardiac: RRR, no JVD Vascular:  Vessel Right Left  Radial Palpable Palpable               Musculoskeletal: M/S 5/5 throughout.  No deformity or atrophy.  Neurologic: Sensation grossly intact in extremities.  Symmetrical.  Speech is fluent.  Psychiatric: Judgment intact, Mood & affect appropriate for pt's clinical situation. Dermatologic: No rashes or ulcers noted.  No cellulitis or open wounds.      Labs Recent Results (from the past 2160 hour(s))  Lipase, blood     Status: None   Collection Time: 03/17/21 12:52 AM  Result Value Ref Range   Lipase 38 11 - 51 U/L    Comment: Performed at [facility], [address] [place], [place] [address]  Comprehensive metabolic panel     Status: Abnormal   Collection Time: 03/17/21 12:52 AM  Result Value Ref Range   Sodium 141 135 - 145 mmol/L   Potassium 4.3 3.5 - 5.1 mmol/L   Chloride 109 98 - 111 mmol/L   CO2 25 22 - 32 mmol/L   Glucose, Bld 128 (H) 70 -  99 mg/dL    Comment: Glucose reference range applies only to samples taken after fasting for at least 8 hours.   BUN 20 8 - 23 mg/dL   Creatinine, Ser 1.06 (H) 0.44 - 1.00 mg/dL   Calcium 9.1 8.9 - 10.3 mg/dL   Total Protein 7.0 6.5 - 8.1 g/dL  Albumin 4.2 3.5 - 5.0 g/dL   AST 28 15 - 41 U/L   ALT 17 0 - 44 U/L   Alkaline Phosphatase 96 38 - 126 U/L   Total Bilirubin 0.9 0.3 - 1.2 mg/dL   GFR, Estimated 51 (L) >60 mL/min    Comment: (NOTE) Calculated using the CKD-EPI Creatinine Equation (2021)    Anion gap 7 5 - 15    Comment: Performed at [facility], [address] [place], [place] [address]  CBC     Status: None   Collection Time: 03/17/21 12:52 AM  Result Value Ref Range   WBC 6.6 4.0 - 10.5 K/uL   RBC 4.36 3.87 - 5.11 MIL/uL   Hemoglobin 13.3 12.0 - 15.0 g/dL   HCT 42.1 36.0 - 46.0 %   MCV 96.6 80.0 - 100.0 fL   MCH 30.5 26.0 - 34.0 pg   MCHC 31.6 30.0 - 36.0 g/dL   RDW 12.6 11.5 - 15.5 %   Platelets 197 150 - 400 K/uL   nRBC 0.0 0.0 - 0.2 %    Comment: Performed at [facility] [place], [place] [address]  Troponin I (High Sensitivity)     Status: None   Collection Time: 03/17/21 12:52 AM  Result Value Ref Range   Troponin I (High Sensitivity) 12 <18 ng/L    Comment: (NOTE) Elevated high sensitivity troponin I (hsTnI) values and significant  changes across serial measurements may suggest ACS but many other  chronic and acute conditions are known to elevate hsTnI results.  Refer to the "Links" section for chest pain algorithms and additional  guidance. Performed at [facility], [place], [place] [address]   Troponin I (High Sensitivity)     Status: Abnormal   Collection Time: 03/17/21  4:12 AM  Result Value Ref Range   Troponin I (High Sensitivity) 18 (H) <18 ng/L    Comment: (NOTE) Elevated high sensitivity troponin I (hsTnI) values and significant  changes across serial measurements may suggest  ACS but many other  chronic and acute conditions are known to elevate hsTnI results.  Refer to the "Links" section for chest pain algorithms and additional  guidance. Performed at [facility], [place], [place] [address]   Troponin I (High Sensitivity)     Status: Abnormal   Collection Time: 03/17/21  6:12 AM  Result Value Ref Range   Troponin I (High Sensitivity) 18 (H) <18 ng/L    Comment: (NOTE) Elevated high sensitivity troponin I (hsTnI) values and significant  changes across serial measurements may suggest ACS but many other  chronic and acute conditions are known to elevate hsTnI results.  Refer to the "Links" section for chest pain algorithms and additional  guidance. Performed at [facility], [address] [place],  [address]     Radiology No results found.  Assessment/Plan Hyperlipidemia lipid control important in reducing the progression of atherosclerotic disease.    Essential hypertension blood pressure control important in reducing the progression of atherosclerotic disease. On appropriate oral medications.  Bilateral carotid artery stenosis Duplex today shows relatively stable stenosis in the 60 to 79% range on the right and a patent left carotid endarterectomy on the left.  No role for intervention on an asymptomatic 88 year old at this range.  Continue to monitor and we have been checking on 38-month intervals and we will continue this.  Continue current medical regimen.    Leotis Pain, MD  04/20/2021 10:44 AM    This note was created with Dragon medical transcription system.  Any errors from dictation are purely unintentional

## 2021-04-20 NOTE — Assessment & Plan Note (Signed)
Duplex today shows relatively stable stenosis in the 60 to 79% range on the right and a patent left carotid endarterectomy on the left.  No role for intervention on an asymptomatic 88 year old at this range.  Continue to monitor and we have been checking on 51-month intervals and we will continue this.  Continue current medical regimen.

## 2021-05-25 ENCOUNTER — Other Ambulatory Visit (INDEPENDENT_AMBULATORY_CARE_PROVIDER_SITE_OTHER): Payer: Self-pay | Admitting: Vascular Surgery

## 2021-05-26 DIAGNOSIS — F411 Generalized anxiety disorder: Secondary | ICD-10-CM | POA: Diagnosis not present

## 2021-05-26 DIAGNOSIS — R0609 Other forms of dyspnea: Secondary | ICD-10-CM | POA: Diagnosis not present

## 2021-05-27 DIAGNOSIS — F411 Generalized anxiety disorder: Secondary | ICD-10-CM | POA: Diagnosis not present

## 2021-05-27 DIAGNOSIS — I10 Essential (primary) hypertension: Secondary | ICD-10-CM | POA: Diagnosis not present

## 2021-06-25 DIAGNOSIS — F411 Generalized anxiety disorder: Secondary | ICD-10-CM | POA: Diagnosis not present

## 2021-07-15 DIAGNOSIS — I10 Essential (primary) hypertension: Secondary | ICD-10-CM | POA: Diagnosis not present

## 2021-07-15 DIAGNOSIS — E782 Mixed hyperlipidemia: Secondary | ICD-10-CM | POA: Diagnosis not present

## 2021-07-15 DIAGNOSIS — I340 Nonrheumatic mitral (valve) insufficiency: Secondary | ICD-10-CM | POA: Diagnosis not present

## 2021-07-15 DIAGNOSIS — I6523 Occlusion and stenosis of bilateral carotid arteries: Secondary | ICD-10-CM | POA: Diagnosis not present

## 2021-07-15 DIAGNOSIS — N1831 Chronic kidney disease, stage 3a: Secondary | ICD-10-CM | POA: Diagnosis not present

## 2021-07-28 ENCOUNTER — Other Ambulatory Visit (INDEPENDENT_AMBULATORY_CARE_PROVIDER_SITE_OTHER): Payer: Self-pay | Admitting: Vascular Surgery

## 2021-08-26 ENCOUNTER — Other Ambulatory Visit (INDEPENDENT_AMBULATORY_CARE_PROVIDER_SITE_OTHER): Payer: Self-pay | Admitting: Vascular Surgery

## 2021-09-22 ENCOUNTER — Other Ambulatory Visit (INDEPENDENT_AMBULATORY_CARE_PROVIDER_SITE_OTHER): Payer: Self-pay | Admitting: Nurse Practitioner

## 2021-10-11 DIAGNOSIS — Z8639 Personal history of other endocrine, nutritional and metabolic disease: Secondary | ICD-10-CM | POA: Diagnosis not present

## 2021-10-11 DIAGNOSIS — E782 Mixed hyperlipidemia: Secondary | ICD-10-CM | POA: Diagnosis not present

## 2021-10-11 DIAGNOSIS — N1831 Chronic kidney disease, stage 3a: Secondary | ICD-10-CM | POA: Diagnosis not present

## 2021-10-11 DIAGNOSIS — I10 Essential (primary) hypertension: Secondary | ICD-10-CM | POA: Diagnosis not present

## 2021-10-11 DIAGNOSIS — N1832 Chronic kidney disease, stage 3b: Secondary | ICD-10-CM | POA: Diagnosis not present

## 2021-10-19 DIAGNOSIS — I10 Essential (primary) hypertension: Secondary | ICD-10-CM | POA: Diagnosis not present

## 2021-10-19 DIAGNOSIS — Z0000 Encounter for general adult medical examination without abnormal findings: Secondary | ICD-10-CM | POA: Diagnosis not present

## 2021-10-19 DIAGNOSIS — Z1389 Encounter for screening for other disorder: Secondary | ICD-10-CM | POA: Diagnosis not present

## 2021-10-19 DIAGNOSIS — Z8639 Personal history of other endocrine, nutritional and metabolic disease: Secondary | ICD-10-CM | POA: Diagnosis not present

## 2021-10-19 DIAGNOSIS — E785 Hyperlipidemia, unspecified: Secondary | ICD-10-CM | POA: Diagnosis not present

## 2021-10-23 ENCOUNTER — Other Ambulatory Visit (INDEPENDENT_AMBULATORY_CARE_PROVIDER_SITE_OTHER): Payer: Self-pay | Admitting: Nurse Practitioner

## 2021-11-22 DIAGNOSIS — H6121 Impacted cerumen, right ear: Secondary | ICD-10-CM | POA: Diagnosis not present

## 2021-12-02 DIAGNOSIS — H6121 Impacted cerumen, right ear: Secondary | ICD-10-CM | POA: Diagnosis not present

## 2021-12-02 DIAGNOSIS — J301 Allergic rhinitis due to pollen: Secondary | ICD-10-CM | POA: Diagnosis not present

## 2021-12-10 DIAGNOSIS — H6121 Impacted cerumen, right ear: Secondary | ICD-10-CM | POA: Diagnosis not present

## 2021-12-20 DIAGNOSIS — H906 Mixed conductive and sensorineural hearing loss, bilateral: Secondary | ICD-10-CM | POA: Diagnosis not present

## 2021-12-20 DIAGNOSIS — H6121 Impacted cerumen, right ear: Secondary | ICD-10-CM | POA: Diagnosis not present

## 2021-12-20 DIAGNOSIS — H6501 Acute serous otitis media, right ear: Secondary | ICD-10-CM | POA: Diagnosis not present

## 2022-01-04 DIAGNOSIS — H90A32 Mixed conductive and sensorineural hearing loss, unilateral, left ear with restricted hearing on the contralateral side: Secondary | ICD-10-CM | POA: Diagnosis not present

## 2022-01-04 DIAGNOSIS — H6503 Acute serous otitis media, bilateral: Secondary | ICD-10-CM | POA: Diagnosis not present

## 2022-01-13 DIAGNOSIS — I10 Essential (primary) hypertension: Secondary | ICD-10-CM | POA: Diagnosis not present

## 2022-01-18 ENCOUNTER — Ambulatory Visit (INDEPENDENT_AMBULATORY_CARE_PROVIDER_SITE_OTHER): Payer: Medicare HMO | Admitting: Vascular Surgery

## 2022-01-18 ENCOUNTER — Encounter (INDEPENDENT_AMBULATORY_CARE_PROVIDER_SITE_OTHER): Payer: Medicare HMO

## 2022-01-20 DIAGNOSIS — N1831 Chronic kidney disease, stage 3a: Secondary | ICD-10-CM | POA: Diagnosis not present

## 2022-01-20 DIAGNOSIS — E782 Mixed hyperlipidemia: Secondary | ICD-10-CM | POA: Diagnosis not present

## 2022-01-20 DIAGNOSIS — I129 Hypertensive chronic kidney disease with stage 1 through stage 4 chronic kidney disease, or unspecified chronic kidney disease: Secondary | ICD-10-CM | POA: Diagnosis not present

## 2022-01-26 DIAGNOSIS — H3540 Unspecified peripheral retinal degeneration: Secondary | ICD-10-CM | POA: Diagnosis not present

## 2022-01-26 DIAGNOSIS — H353233 Exudative age-related macular degeneration, bilateral, with inactive scar: Secondary | ICD-10-CM | POA: Diagnosis not present

## 2022-01-26 DIAGNOSIS — H43813 Vitreous degeneration, bilateral: Secondary | ICD-10-CM | POA: Diagnosis not present

## 2022-01-26 DIAGNOSIS — H35422 Microcystoid degeneration of retina, left eye: Secondary | ICD-10-CM | POA: Diagnosis not present

## 2022-01-28 DIAGNOSIS — I10 Essential (primary) hypertension: Secondary | ICD-10-CM | POA: Diagnosis not present

## 2022-01-28 DIAGNOSIS — I340 Nonrheumatic mitral (valve) insufficiency: Secondary | ICD-10-CM | POA: Diagnosis not present

## 2022-01-28 DIAGNOSIS — N1831 Chronic kidney disease, stage 3a: Secondary | ICD-10-CM | POA: Diagnosis not present

## 2022-01-28 DIAGNOSIS — I6523 Occlusion and stenosis of bilateral carotid arteries: Secondary | ICD-10-CM | POA: Diagnosis not present

## 2022-01-28 DIAGNOSIS — E782 Mixed hyperlipidemia: Secondary | ICD-10-CM | POA: Diagnosis not present

## 2022-04-26 DIAGNOSIS — E782 Mixed hyperlipidemia: Secondary | ICD-10-CM | POA: Diagnosis not present

## 2022-05-03 DIAGNOSIS — F411 Generalized anxiety disorder: Secondary | ICD-10-CM | POA: Diagnosis not present

## 2022-05-03 DIAGNOSIS — E782 Mixed hyperlipidemia: Secondary | ICD-10-CM | POA: Diagnosis not present

## 2022-05-03 DIAGNOSIS — N1831 Chronic kidney disease, stage 3a: Secondary | ICD-10-CM | POA: Diagnosis not present

## 2022-05-03 DIAGNOSIS — I129 Hypertensive chronic kidney disease with stage 1 through stage 4 chronic kidney disease, or unspecified chronic kidney disease: Secondary | ICD-10-CM | POA: Diagnosis not present

## 2022-08-02 DIAGNOSIS — E782 Mixed hyperlipidemia: Secondary | ICD-10-CM | POA: Diagnosis not present

## 2022-08-09 DIAGNOSIS — N1832 Chronic kidney disease, stage 3b: Secondary | ICD-10-CM | POA: Diagnosis not present

## 2022-08-09 DIAGNOSIS — E782 Mixed hyperlipidemia: Secondary | ICD-10-CM | POA: Diagnosis not present

## 2022-08-09 DIAGNOSIS — R350 Frequency of micturition: Secondary | ICD-10-CM | POA: Diagnosis not present

## 2022-08-09 DIAGNOSIS — I129 Hypertensive chronic kidney disease with stage 1 through stage 4 chronic kidney disease, or unspecified chronic kidney disease: Secondary | ICD-10-CM | POA: Diagnosis not present

## 2022-10-31 ENCOUNTER — Other Ambulatory Visit (INDEPENDENT_AMBULATORY_CARE_PROVIDER_SITE_OTHER): Payer: Self-pay | Admitting: Nurse Practitioner

## 2022-10-31 NOTE — Telephone Encounter (Signed)
Patient needs to make a follow up as soon as possible

## 2022-11-09 DIAGNOSIS — E782 Mixed hyperlipidemia: Secondary | ICD-10-CM | POA: Diagnosis not present

## 2022-11-09 DIAGNOSIS — N1832 Chronic kidney disease, stage 3b: Secondary | ICD-10-CM | POA: Diagnosis not present

## 2022-11-09 DIAGNOSIS — I10 Essential (primary) hypertension: Secondary | ICD-10-CM | POA: Diagnosis not present

## 2022-11-16 DIAGNOSIS — E785 Hyperlipidemia, unspecified: Secondary | ICD-10-CM | POA: Diagnosis not present

## 2022-11-16 DIAGNOSIS — Z1331 Encounter for screening for depression: Secondary | ICD-10-CM | POA: Diagnosis not present

## 2022-11-16 DIAGNOSIS — I10 Essential (primary) hypertension: Secondary | ICD-10-CM | POA: Diagnosis not present

## 2022-11-16 DIAGNOSIS — Z0000 Encounter for general adult medical examination without abnormal findings: Secondary | ICD-10-CM | POA: Diagnosis not present

## 2022-12-09 DIAGNOSIS — N390 Urinary tract infection, site not specified: Secondary | ICD-10-CM | POA: Diagnosis not present

## 2022-12-09 DIAGNOSIS — R300 Dysuria: Secondary | ICD-10-CM | POA: Diagnosis not present

## 2022-12-12 ENCOUNTER — Emergency Department: Payer: Medicare HMO

## 2022-12-12 ENCOUNTER — Other Ambulatory Visit: Payer: Self-pay

## 2022-12-12 ENCOUNTER — Inpatient Hospital Stay
Admission: EM | Admit: 2022-12-12 | Discharge: 2022-12-16 | DRG: 291 | Disposition: A | Discharge status: Home Health | Payer: Medicare HMO | Attending: Internal Medicine | Admitting: Internal Medicine

## 2022-12-12 DIAGNOSIS — I6523 Occlusion and stenosis of bilateral carotid arteries: Secondary | ICD-10-CM | POA: Diagnosis present

## 2022-12-12 DIAGNOSIS — R9431 Abnormal electrocardiogram [ECG] [EKG]: Secondary | ICD-10-CM | POA: Diagnosis present

## 2022-12-12 DIAGNOSIS — Z87440 Personal history of urinary (tract) infections: Secondary | ICD-10-CM

## 2022-12-12 DIAGNOSIS — Z90710 Acquired absence of both cervix and uterus: Secondary | ICD-10-CM | POA: Diagnosis not present

## 2022-12-12 DIAGNOSIS — R197 Diarrhea, unspecified: Secondary | ICD-10-CM | POA: Diagnosis present

## 2022-12-12 DIAGNOSIS — K5730 Diverticulosis of large intestine without perforation or abscess without bleeding: Secondary | ICD-10-CM | POA: Diagnosis present

## 2022-12-12 DIAGNOSIS — R918 Other nonspecific abnormal finding of lung field: Secondary | ICD-10-CM | POA: Diagnosis not present

## 2022-12-12 DIAGNOSIS — J9601 Acute respiratory failure with hypoxia: Secondary | ICD-10-CM | POA: Diagnosis not present

## 2022-12-12 DIAGNOSIS — R1032 Left lower quadrant pain: Secondary | ICD-10-CM | POA: Diagnosis present

## 2022-12-12 DIAGNOSIS — I160 Hypertensive urgency: Secondary | ICD-10-CM | POA: Diagnosis not present

## 2022-12-12 DIAGNOSIS — I10 Essential (primary) hypertension: Secondary | ICD-10-CM | POA: Diagnosis present

## 2022-12-12 DIAGNOSIS — E876 Hypokalemia: Secondary | ICD-10-CM | POA: Diagnosis present

## 2022-12-12 DIAGNOSIS — K219 Gastro-esophageal reflux disease without esophagitis: Secondary | ICD-10-CM | POA: Diagnosis present

## 2022-12-12 DIAGNOSIS — N179 Acute kidney failure, unspecified: Secondary | ICD-10-CM | POA: Diagnosis not present

## 2022-12-12 DIAGNOSIS — Z9049 Acquired absence of other specified parts of digestive tract: Secondary | ICD-10-CM | POA: Diagnosis not present

## 2022-12-12 DIAGNOSIS — R7989 Other specified abnormal findings of blood chemistry: Secondary | ICD-10-CM | POA: Diagnosis present

## 2022-12-12 DIAGNOSIS — E785 Hyperlipidemia, unspecified: Secondary | ICD-10-CM | POA: Diagnosis not present

## 2022-12-12 DIAGNOSIS — Z1152 Encounter for screening for COVID-19: Secondary | ICD-10-CM | POA: Diagnosis not present

## 2022-12-12 DIAGNOSIS — T501X5A Adverse effect of loop [high-ceiling] diuretics, initial encounter: Secondary | ICD-10-CM | POA: Diagnosis not present

## 2022-12-12 DIAGNOSIS — N1831 Chronic kidney disease, stage 3a: Secondary | ICD-10-CM | POA: Diagnosis not present

## 2022-12-12 DIAGNOSIS — R109 Unspecified abdominal pain: Secondary | ICD-10-CM | POA: Diagnosis present

## 2022-12-12 DIAGNOSIS — F411 Generalized anxiety disorder: Secondary | ICD-10-CM | POA: Diagnosis present

## 2022-12-12 DIAGNOSIS — R5381 Other malaise: Secondary | ICD-10-CM | POA: Diagnosis present

## 2022-12-12 DIAGNOSIS — N281 Cyst of kidney, acquired: Secondary | ICD-10-CM | POA: Diagnosis not present

## 2022-12-12 DIAGNOSIS — Z8719 Personal history of other diseases of the digestive system: Secondary | ICD-10-CM

## 2022-12-12 DIAGNOSIS — J90 Pleural effusion, not elsewhere classified: Secondary | ICD-10-CM | POA: Diagnosis not present

## 2022-12-12 DIAGNOSIS — Z7982 Long term (current) use of aspirin: Secondary | ICD-10-CM | POA: Diagnosis not present

## 2022-12-12 DIAGNOSIS — N183 Chronic kidney disease, stage 3 unspecified: Secondary | ICD-10-CM | POA: Diagnosis present

## 2022-12-12 DIAGNOSIS — I712 Thoracic aortic aneurysm, without rupture, unspecified: Secondary | ICD-10-CM | POA: Diagnosis not present

## 2022-12-12 DIAGNOSIS — I130 Hypertensive heart and chronic kidney disease with heart failure and stage 1 through stage 4 chronic kidney disease, or unspecified chronic kidney disease: Secondary | ICD-10-CM | POA: Diagnosis not present

## 2022-12-12 DIAGNOSIS — N189 Chronic kidney disease, unspecified: Secondary | ICD-10-CM | POA: Diagnosis present

## 2022-12-12 DIAGNOSIS — E7800 Pure hypercholesterolemia, unspecified: Secondary | ICD-10-CM | POA: Diagnosis present

## 2022-12-12 DIAGNOSIS — I6529 Occlusion and stenosis of unspecified carotid artery: Secondary | ICD-10-CM | POA: Diagnosis present

## 2022-12-12 DIAGNOSIS — R000 Tachycardia, unspecified: Secondary | ICD-10-CM | POA: Diagnosis not present

## 2022-12-12 DIAGNOSIS — Z79899 Other long term (current) drug therapy: Secondary | ICD-10-CM | POA: Diagnosis not present

## 2022-12-12 DIAGNOSIS — R932 Abnormal findings on diagnostic imaging of liver and biliary tract: Secondary | ICD-10-CM | POA: Diagnosis not present

## 2022-12-12 DIAGNOSIS — D7389 Other diseases of spleen: Secondary | ICD-10-CM | POA: Diagnosis not present

## 2022-12-12 DIAGNOSIS — I5033 Acute on chronic diastolic (congestive) heart failure: Secondary | ICD-10-CM | POA: Diagnosis present

## 2022-12-12 DIAGNOSIS — Z7902 Long term (current) use of antithrombotics/antiplatelets: Secondary | ICD-10-CM

## 2022-12-12 DIAGNOSIS — R778 Other specified abnormalities of plasma proteins: Secondary | ICD-10-CM | POA: Diagnosis not present

## 2022-12-12 HISTORY — DX: Unspecified macular degeneration: H35.30

## 2022-12-12 LAB — URINALYSIS, ROUTINE W REFLEX MICROSCOPIC
Bilirubin Urine: NEGATIVE
Glucose, UA: NEGATIVE mg/dL
Hgb urine dipstick: NEGATIVE
Ketones, ur: NEGATIVE mg/dL
Leukocytes,Ua: NEGATIVE
Nitrite: NEGATIVE
Protein, ur: NEGATIVE mg/dL
Specific Gravity, Urine: 1.008 (ref 1.005–1.030)
pH: 5 (ref 5.0–8.0)

## 2022-12-12 LAB — CBC
HCT: 37.3 % (ref 36.0–46.0)
Hemoglobin: 12 g/dL (ref 12.0–15.0)
MCH: 30.8 pg (ref 26.0–34.0)
MCHC: 32.2 g/dL (ref 30.0–36.0)
MCV: 95.6 fL (ref 80.0–100.0)
Platelets: 242 10*3/uL (ref 150–400)
RBC: 3.9 MIL/uL (ref 3.87–5.11)
RDW: 13.5 % (ref 11.5–15.5)
WBC: 11.5 10*3/uL — ABNORMAL HIGH (ref 4.0–10.5)
nRBC: 0 % (ref 0.0–0.2)

## 2022-12-12 LAB — COMPREHENSIVE METABOLIC PANEL
ALT: 14 U/L (ref 0–44)
AST: 15 U/L (ref 15–41)
Albumin: 4 g/dL (ref 3.5–5.0)
Alkaline Phosphatase: 66 U/L (ref 38–126)
Anion gap: 12 (ref 5–15)
BUN: 12 mg/dL (ref 8–23)
CO2: 20 mmol/L — ABNORMAL LOW (ref 22–32)
Calcium: 9 mg/dL (ref 8.9–10.3)
Chloride: 111 mmol/L (ref 98–111)
Creatinine, Ser: 0.93 mg/dL (ref 0.44–1.00)
GFR, Estimated: 59 mL/min — ABNORMAL LOW (ref >60)
Glucose, Bld: 110 mg/dL — ABNORMAL HIGH (ref 70–99)
Potassium: 3.5 mmol/L (ref 3.5–5.1)
Sodium: 143 mmol/L (ref 135–145)
Total Bilirubin: 0.9 mg/dL (ref 0.3–1.2)
Total Protein: 6.5 g/dL (ref 6.5–8.1)

## 2022-12-12 LAB — LIPASE, BLOOD: Lipase: 39 U/L (ref 11–51)

## 2022-12-12 LAB — TROPONIN I (HIGH SENSITIVITY)
Troponin I (High Sensitivity): 28 ng/L — ABNORMAL HIGH (ref <18)
Troponin I (High Sensitivity): 35 ng/L — ABNORMAL HIGH (ref <18)

## 2022-12-12 MED ORDER — SODIUM CHLORIDE 0.9 % IV BOLUS
1000.0000 mL | Freq: Once | INTRAVENOUS | Status: AC
  Administered 2022-12-12: 1000 mL via INTRAVENOUS

## 2022-12-12 MED ORDER — IOHEXOL 300 MG/ML  SOLN
75.0000 mL | Freq: Once | INTRAMUSCULAR | Status: AC | PRN
  Administered 2022-12-12: 75 mL via INTRAVENOUS

## 2022-12-12 MED ORDER — IOHEXOL 350 MG/ML SOLN
60.0000 mL | Freq: Once | INTRAVENOUS | Status: AC | PRN
  Administered 2022-12-12: 60 mL via INTRAVENOUS

## 2022-12-12 MED ORDER — MORPHINE SULFATE (PF) 2 MG/ML IV SOLN
2.0000 mg | Freq: Once | INTRAVENOUS | Status: AC
  Administered 2022-12-12: 2 mg via INTRAVENOUS
  Filled 2022-12-12: qty 1

## 2022-12-12 MED ORDER — ASPIRIN 325 MG PO TABS
325.0000 mg | ORAL_TABLET | Freq: Once | ORAL | Status: AC
  Administered 2022-12-12: 325 mg via ORAL
  Filled 2022-12-12: qty 1

## 2022-12-12 NOTE — ED Triage Notes (Signed)
Pt sts that her abd pain has been going on for a week now. Pt sts that has not had N/V/D.

## 2022-12-12 NOTE — ED Provider Notes (Signed)
[facility] Provider Note    Event Date/Time   First MD Initiated Contact with Patient 12/12/22 1703     (approximate)   History   Abdominal Pain   HPI  Sharon Barajas is a 89 year old female with history of HTN, GERD, diverticulosis presenting to the emergency department for evaluation of abdominal pain.  Accompanied by family who provides collateral history.  Last week, patient began develop urinary frequency with some dysuria.  She was seen by her primary care doctor and placed on cefdinir.  She has taken this for the past few days, but continues to have symptoms including left flank pain.  They called the primary care office who recommended that they present back to the ER.  In documentation from office, it does appear that her urine culture actually returned negative.  Patient denies nausea, vomiting, changes in bowel habits.      Physical Exam   Triage Vital Signs: ED Triage Vitals [12/12/22 1528]  Encounter Vitals Group     BP (!) 182/101     Systolic BP Percentile      Diastolic BP Percentile      Pulse Rate (!) 112     Resp 16     Temp 97.8 F (36.6 C)     Temp Source Oral     SpO2 91 %     Weight 98 lb (44.5 kg)     Height 4\' 10"  (1.473 m)     Head Circumference      Peak Flow      Pain Score 7     Pain Loc      Pain Education      Exclude from Growth Chart     Most recent vital signs: Vitals:   12/12/22 2200 12/12/22 2330  BP: (!) 160/62 (!) 161/72  Pulse: (!) 109 (!) 102  Resp: (!) 25 (!) 30  Temp:    SpO2: 94% 96%     General: Awake, interactive  CV:  Regular rate, good peripheral perfusion.  Resp:  Lungs clear, unlabored respirations.  Abd:  Soft, nondistended, tender to palpation primarily in the left lower quadrant without rebound or guarding Neuro:  Symmetric facial movement, fluid speech   ED Results / Procedures / Treatments   Labs (all labs ordered are listed, but only abnormal results are  displayed) Labs Reviewed  COMPREHENSIVE METABOLIC PANEL - Abnormal; Notable for the following components:      Result Value   CO2 20 (*)    Glucose, Bld 110 (*)    GFR, Estimated 59 (*)    All other components within normal limits  CBC - Abnormal; Notable for the following components:   WBC 11.5 (*)    All other components within normal limits  URINALYSIS, ROUTINE W REFLEX MICROSCOPIC - Abnormal; Notable for the following components:   Color, Urine STRAW (*)    APPearance CLEAR (*)    All other components within normal limits  TROPONIN I (HIGH SENSITIVITY) - Abnormal; Notable for the following components:   Troponin I (High Sensitivity) 28 (*)    All other components within normal limits  TROPONIN I (HIGH SENSITIVITY) - Abnormal; Notable for the following components:   Troponin I (High Sensitivity) 35 (*)    All other components within normal limits  LIPASE, BLOOD     EKG EKG independently reviewed interpreted by myself (ER attending) demonstrates:  EKG demonstrates sinus tachycardia at a rate of 114, PR 144, QRS 90, QTc  423, ST depressions noted in multiple leads   RADIOLOGY Imaging independently reviewed and interpreted by myself demonstrates:  CT abd pelvis without SBO CT angio pending  PROCEDURES:  Critical Care performed: No  Procedures   MEDICATIONS ORDERED IN ED: Medications  sodium chloride 0.9 % bolus 1,000 mL (0 mLs Intravenous Stopped 12/12/22 2033)  morphine (PF) 2 MG/ML injection 2 mg (2 mg Intravenous Given 12/12/22 1822)  iohexol (OMNIPAQUE) 300 MG/ML solution 75 mL (75 mLs Intravenous Contrast Given 12/12/22 1827)  sodium chloride 0.9 % bolus 1,000 mL (0 mLs Intravenous Stopped 12/12/22 2255)  aspirin tablet 325 mg (325 mg Oral Given 12/12/22 2131)  iohexol (OMNIPAQUE) 350 MG/ML injection 60 mL (60 mLs Intravenous Contrast Given 12/12/22 2212)     IMPRESSION / MDM / ASSESSMENT AND PLAN / ED COURSE  I reviewed the triage vital signs and the nursing  notes.  Differential diagnosis includes, but is not limited to, diverticulitis, UTI, pyelonephritis, other acute intra-abdominal process, renal stone  Patient's presentation is most consistent with acute presentation with potential threat to life or bodily function.  89 year old female presenting to the emergency department for evaluation of abdominal pain.  Mild tachycardia on presentation.  Labs from triage with mild leukocytosis.  Will obtain CT abdomen pelvis to further evaluate and treat symptomatically with IV fluids and pain medication. Clinical Course as of 12/12/22 2331  Mon Dec 12, 2022  2119 Labs with mild leukocytosis with WC of 11.5.  Initial troponin elevated at 28, increased to 35 on repeat.  Initial delay in CT read, but did ultimately result with diverticulosis without evidence of diverticulitis.  On reevaluation, patient is now on 2 L satting in the low 90s.  She continues to deny any cardiorespiratory complaints, but with her abnormal EKG, new oxygen requirement, and tachycardia I am concerned about a pulmonary embolism.  CTA ordered.  Ultimate plan for admission following this in the setting of her new hypoxia, tachycardia, elevated troponin. [NR]  2330 CTA read pending at time of signout.  Patient now on 3 L satting in the mid 90s.  Ultimately anticipate she will require admission.  Signed out at 2300 to oncoming physician.  [NR]    Clinical Course User Index [NR] Trinna Post, MD     FINAL CLINICAL IMPRESSION(S) / ED DIAGNOSES   Final diagnoses:  Left lower quadrant abdominal pain  Elevated troponin  Abnormal EKG     Rx / DC Orders   ED Discharge Orders     None        Note:  This document was prepared using Dragon voice recognition software and may include unintentional dictation errors.   Trinna Post, MD 12/12/22 [phone]

## 2022-12-12 NOTE — ED Notes (Signed)
Called lab to add-on troponin to original labs sent.

## 2022-12-12 NOTE — ED Notes (Signed)
This tech attempted to obtain a blood sample from pt. Two attempts were made with no blood in vial. Lab was called to obtain pt's blood.

## 2022-12-13 ENCOUNTER — Other Ambulatory Visit: Payer: Medicare HMO

## 2022-12-13 ENCOUNTER — Encounter: Payer: Self-pay | Admitting: Internal Medicine

## 2022-12-13 DIAGNOSIS — N179 Acute kidney failure, unspecified: Secondary | ICD-10-CM | POA: Diagnosis not present

## 2022-12-13 DIAGNOSIS — Z8719 Personal history of other diseases of the digestive system: Secondary | ICD-10-CM | POA: Diagnosis not present

## 2022-12-13 DIAGNOSIS — Z90710 Acquired absence of both cervix and uterus: Secondary | ICD-10-CM | POA: Diagnosis not present

## 2022-12-13 DIAGNOSIS — E876 Hypokalemia: Secondary | ICD-10-CM | POA: Diagnosis present

## 2022-12-13 DIAGNOSIS — E785 Hyperlipidemia, unspecified: Secondary | ICD-10-CM | POA: Diagnosis not present

## 2022-12-13 DIAGNOSIS — Z9049 Acquired absence of other specified parts of digestive tract: Secondary | ICD-10-CM | POA: Diagnosis not present

## 2022-12-13 DIAGNOSIS — E7800 Pure hypercholesterolemia, unspecified: Secondary | ICD-10-CM | POA: Diagnosis present

## 2022-12-13 DIAGNOSIS — J9601 Acute respiratory failure with hypoxia: Secondary | ICD-10-CM | POA: Diagnosis not present

## 2022-12-13 DIAGNOSIS — R109 Unspecified abdominal pain: Secondary | ICD-10-CM | POA: Diagnosis present

## 2022-12-13 DIAGNOSIS — K219 Gastro-esophageal reflux disease without esophagitis: Secondary | ICD-10-CM | POA: Diagnosis present

## 2022-12-13 DIAGNOSIS — K5730 Diverticulosis of large intestine without perforation or abscess without bleeding: Secondary | ICD-10-CM | POA: Diagnosis present

## 2022-12-13 DIAGNOSIS — Z1152 Encounter for screening for COVID-19: Secondary | ICD-10-CM | POA: Diagnosis not present

## 2022-12-13 DIAGNOSIS — I6523 Occlusion and stenosis of bilateral carotid arteries: Secondary | ICD-10-CM | POA: Diagnosis present

## 2022-12-13 DIAGNOSIS — I5033 Acute on chronic diastolic (congestive) heart failure: Secondary | ICD-10-CM | POA: Diagnosis present

## 2022-12-13 DIAGNOSIS — N1831 Chronic kidney disease, stage 3a: Secondary | ICD-10-CM | POA: Diagnosis present

## 2022-12-13 DIAGNOSIS — N189 Chronic kidney disease, unspecified: Secondary | ICD-10-CM | POA: Diagnosis not present

## 2022-12-13 DIAGNOSIS — T501X5A Adverse effect of loop [high-ceiling] diuretics, initial encounter: Secondary | ICD-10-CM | POA: Diagnosis not present

## 2022-12-13 DIAGNOSIS — R1032 Left lower quadrant pain: Secondary | ICD-10-CM | POA: Diagnosis present

## 2022-12-13 DIAGNOSIS — R197 Diarrhea, unspecified: Secondary | ICD-10-CM | POA: Diagnosis present

## 2022-12-13 DIAGNOSIS — R5381 Other malaise: Secondary | ICD-10-CM | POA: Diagnosis present

## 2022-12-13 DIAGNOSIS — R9431 Abnormal electrocardiogram [ECG] [EKG]: Secondary | ICD-10-CM | POA: Diagnosis present

## 2022-12-13 DIAGNOSIS — I160 Hypertensive urgency: Secondary | ICD-10-CM | POA: Diagnosis present

## 2022-12-13 DIAGNOSIS — F411 Generalized anxiety disorder: Secondary | ICD-10-CM | POA: Diagnosis present

## 2022-12-13 DIAGNOSIS — R7989 Other specified abnormal findings of blood chemistry: Secondary | ICD-10-CM | POA: Diagnosis present

## 2022-12-13 DIAGNOSIS — I10 Essential (primary) hypertension: Secondary | ICD-10-CM | POA: Diagnosis not present

## 2022-12-13 DIAGNOSIS — Z79899 Other long term (current) drug therapy: Secondary | ICD-10-CM | POA: Diagnosis not present

## 2022-12-13 DIAGNOSIS — I130 Hypertensive heart and chronic kidney disease with heart failure and stage 1 through stage 4 chronic kidney disease, or unspecified chronic kidney disease: Secondary | ICD-10-CM | POA: Diagnosis present

## 2022-12-13 DIAGNOSIS — Z87440 Personal history of urinary (tract) infections: Secondary | ICD-10-CM | POA: Diagnosis not present

## 2022-12-13 DIAGNOSIS — Z7982 Long term (current) use of aspirin: Secondary | ICD-10-CM | POA: Diagnosis not present

## 2022-12-13 LAB — COMPREHENSIVE METABOLIC PANEL
ALT: 12 U/L (ref 0–44)
AST: 13 U/L — ABNORMAL LOW (ref 15–41)
Albumin: 3.3 g/dL — ABNORMAL LOW (ref 3.5–5.0)
Alkaline Phosphatase: 53 U/L (ref 38–126)
Anion gap: 9 (ref 5–15)
BUN: 8 mg/dL (ref 8–23)
CO2: 24 mmol/L (ref 22–32)
Calcium: 8.1 mg/dL — ABNORMAL LOW (ref 8.9–10.3)
Chloride: 109 mmol/L (ref 98–111)
Creatinine, Ser: 0.79 mg/dL (ref 0.44–1.00)
GFR, Estimated: >60 mL/min (ref >60)
Glucose, Bld: 105 mg/dL — ABNORMAL HIGH (ref 70–99)
Potassium: 3.4 mmol/L — ABNORMAL LOW (ref 3.5–5.1)
Sodium: 142 mmol/L (ref 135–145)
Total Bilirubin: 0.8 mg/dL (ref 0.3–1.2)
Total Protein: 5.4 g/dL — ABNORMAL LOW (ref 6.5–8.1)

## 2022-12-13 LAB — CBC
HCT: 32.7 % — ABNORMAL LOW (ref 36.0–46.0)
Hemoglobin: 10.6 g/dL — ABNORMAL LOW (ref 12.0–15.0)
MCH: 31 pg (ref 26.0–34.0)
MCHC: 32.4 g/dL (ref 30.0–36.0)
MCV: 95.6 fL (ref 80.0–100.0)
Platelets: 194 10*3/uL (ref 150–400)
RBC: 3.42 MIL/uL — ABNORMAL LOW (ref 3.87–5.11)
RDW: 13.5 % (ref 11.5–15.5)
WBC: 7.4 10*3/uL (ref 4.0–10.5)
nRBC: 0 % (ref 0.0–0.2)

## 2022-12-13 LAB — PROCALCITONIN: Procalcitonin: 0.62 ng/mL

## 2022-12-13 LAB — BRAIN NATRIURETIC PEPTIDE: B Natriuretic Peptide: 522.6 pg/mL — ABNORMAL HIGH (ref 0.0–100.0)

## 2022-12-13 LAB — TSH: TSH: 1.412 u[IU]/mL (ref 0.350–4.500)

## 2022-12-13 LAB — SARS CORONAVIRUS 2 BY RT PCR: SARS Coronavirus 2 by RT PCR: NEGATIVE

## 2022-12-13 MED ORDER — HYDRALAZINE HCL 25 MG PO TABS
25.0000 mg | ORAL_TABLET | Freq: Three times a day (TID) | ORAL | Status: DC
  Administered 2022-12-13 – 2022-12-16 (×9): 25 mg via ORAL
  Filled 2022-12-13 (×9): qty 1

## 2022-12-13 MED ORDER — ASPIRIN 81 MG PO TBEC
81.0000 mg | DELAYED_RELEASE_TABLET | Freq: Every day | ORAL | Status: DC
  Administered 2022-12-13 – 2022-12-16 (×4): 81 mg via ORAL
  Filled 2022-12-13 (×4): qty 1

## 2022-12-13 MED ORDER — ENOXAPARIN SODIUM 30 MG/0.3ML IJ SOSY
30.0000 mg | PREFILLED_SYRINGE | INTRAMUSCULAR | Status: DC
  Administered 2022-12-13 – 2022-12-16 (×4): 30 mg via SUBCUTANEOUS
  Filled 2022-12-13 (×4): qty 0.3

## 2022-12-13 MED ORDER — ONDANSETRON HCL 4 MG/2ML IJ SOLN: 4.0000 mg | Freq: Four times a day (QID) | INTRAMUSCULAR | Status: DC | PRN

## 2022-12-13 MED ORDER — LISINOPRIL 20 MG PO TABS: 20.0000 mg | ORAL_TABLET | Freq: Every day | ORAL | Status: DC

## 2022-12-13 MED ORDER — CARVEDILOL 3.125 MG PO TABS
3.1250 mg | ORAL_TABLET | Freq: Two times a day (BID) | ORAL | Status: DC
  Administered 2022-12-13 – 2022-12-16 (×7): 3.125 mg via ORAL
  Filled 2022-12-13 (×7): qty 1

## 2022-12-13 MED ORDER — AMLODIPINE BESYLATE 10 MG PO TABS
10.0000 mg | ORAL_TABLET | ORAL | Status: DC
  Administered 2022-12-14 – 2022-12-16 (×3): 10 mg via ORAL
  Filled 2022-12-13 (×3): qty 1

## 2022-12-13 MED ORDER — PANTOPRAZOLE SODIUM 20 MG PO TBEC
20.0000 mg | DELAYED_RELEASE_TABLET | Freq: Every day | ORAL | Status: DC
  Administered 2022-12-13 – 2022-12-16 (×4): 20 mg via ORAL
  Filled 2022-12-13 (×4): qty 1

## 2022-12-13 MED ORDER — ATORVASTATIN CALCIUM 20 MG PO TABS
20.0000 mg | ORAL_TABLET | Freq: Every day | ORAL | Status: DC
  Administered 2022-12-13 – 2022-12-16 (×4): 20 mg via ORAL
  Filled 2022-12-13 (×4): qty 1

## 2022-12-13 MED ORDER — FUROSEMIDE 10 MG/ML IJ SOLN
20.0000 mg | Freq: Two times a day (BID) | INTRAMUSCULAR | Status: DC
  Administered 2022-12-13: 20 mg via INTRAVENOUS
  Filled 2022-12-13: qty 2

## 2022-12-13 MED ORDER — CLOPIDOGREL BISULFATE 75 MG PO TABS
75.0000 mg | ORAL_TABLET | Freq: Every day | ORAL | Status: DC
  Administered 2022-12-13 – 2022-12-16 (×4): 75 mg via ORAL
  Filled 2022-12-13 (×4): qty 1

## 2022-12-13 MED ORDER — FUROSEMIDE 10 MG/ML IJ SOLN
40.0000 mg | Freq: Two times a day (BID) | INTRAMUSCULAR | Status: DC
  Administered 2022-12-13 – 2022-12-14 (×2): 40 mg via INTRAVENOUS
  Filled 2022-12-13 (×2): qty 4

## 2022-12-13 MED ORDER — NITROGLYCERIN 0.4 MG SL SUBL: 0.4000 mg | SUBLINGUAL_TABLET | SUBLINGUAL | Status: DC | PRN

## 2022-12-13 MED ORDER — ONDANSETRON 4 MG PO TBDP: 4.0000 mg | ORAL_TABLET | Freq: Three times a day (TID) | ORAL | Status: DC | PRN

## 2022-12-13 MED ORDER — ONDANSETRON HCL 4 MG PO TABS: 4.0000 mg | ORAL_TABLET | Freq: Four times a day (QID) | ORAL | Status: DC | PRN

## 2022-12-13 MED ORDER — POTASSIUM CHLORIDE CRYS ER 20 MEQ PO TBCR
40.0000 meq | EXTENDED_RELEASE_TABLET | ORAL | Status: AC
  Administered 2022-12-13 (×2): 40 meq via ORAL
  Filled 2022-12-13 (×2): qty 2

## 2022-12-13 NOTE — Plan of Care (Signed)
  Problem: Education: Goal: Knowledge of General Education information will improve Description Including pain rating scale, medication(s)/side effects and non-pharmacologic comfort measures Outcome: Progressing   

## 2022-12-13 NOTE — Progress Notes (Signed)
Transition of Care [facility]) - Inpatient Brief Assessment   Patient Details  Name: Sharon Barajas MRN: [id number] Date of Birth: 12/05/1931  Transition [facility] Care [facility]) CM/SW Contact:    Marquita Palms, LCSW Phone Number: 12/13/2022, 10:10 AM   Clinical Narrative:  Pt currently on 3L of new O2. TOC will follow for DC planning needs.  Transition of Care Asessment: Insurance and Status: Insurance coverage has been reviewed Patient has primary care physician: Yes Home environment has been reviewed: home Prior level of function:: independent Prior/Current Home Services: No current home services Social Determinants of Health Reivew: SDOH reviewed no interventions necessary Readmission risk has been reviewed: Yes Transition of care needs: no transition of care needs at this time

## 2022-12-13 NOTE — Progress Notes (Signed)
Anticoagulation monitoring(Lovenox):  89 yo  female ordered Lovenox 40 mg Q24h    Filed Weights   12/12/22 1528  Weight: 44.5 kg (98 lb)   BMI 20.5    Lab Results  Component Value Date   CREATININE 0.93 12/12/2022   CREATININE 1.06 (H) 03/17/2021   CREATININE 1.51 (H) 05/01/2018   Estimated Creatinine Clearance: 27 mL/min (by C-G formula based on SCr of 0.93 mg/dL). Hemoglobin & Hematocrit     Component Value Date/Time   HGB 12.0 12/12/2022 1608   HGB 13.4 09/26/2012 1340   HCT 37.3 12/12/2022 1608   HCT 39.0 09/26/2012 1340     Per Protocol for Patient with estCrcl < 30 ml/min and BMI < 30, will transition to Lovenox 30 mg Q24h.

## 2022-12-13 NOTE — ED Notes (Signed)
Alerted 2A of patient's departure to their unit

## 2022-12-13 NOTE — H&P (Signed)
History and Physical    Patient: Sharon Barajas DOB: [birth year] DOA: 12/12/2022 DOS: the patient was seen and examined on 12/13/2022 PCP: [name], MD  Patient coming from: Home  Chief Complaint:  Chief Complaint  Patient presents with   Abdominal Pain   HPI: [place] is a 89 y.o. female with medical history significant of carotid artery stenosis, hypertension, hyperlipidemia, history of cholecystectomy, who presented to the ER with mainly abdominal pain.  Patient has abdominal pain in the upper area.  Denied any nausea or vomiting normal hematemesis no melena.  Patient has had previous diverticulitis apparently.  Also GERD.  Patient also described dysuria and frequency last week for which she was treated with cefdinir by primary care physician.  In the ER initial thought was possible UTI versus diverticulitis but CT abdomen pelvis showed only diverticulosis with no further evidence of diverticulitis.  Patient found to be hypoxic with new oxygen requirement.  She has hypertensive urgency with blood pressure 182/101, also new EKG change with some diffuse ST depression.  Troponins are flat.  She does require O2 3 L patient is being admitted with new onset hypoxia and workup is ongoing.  Review of Systems: As mentioned in the history of present illness. All other systems reviewed and are negative. 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   Hypercholesteremia    Hypertension    Past Surgical History:  Procedure Laterality Date   ABDOMINAL HYSTERECTOMY     CHOLECYSTECTOMY     COLON SURGERY     COLONOSCOPY WITH PROPOFOL N/A 02/23/2016   Procedure: COLONOSCOPY WITH PROPOFOL;  Surgeon: Christena Deem, MD;  Location: [facility] ENDOSCOPY;  Service: Endoscopy;  Laterality: N/A;   ENDARTERECTOMY Left 12/17/2014   Procedure: ENDARTERECTOMY CAROTID;   Surgeon: Annice Needy, MD;  Location: [facility] ORS;  Service: Vascular;  Laterality: Left;   INNER EAR SURGERY Left    Removal of ear tumor.   Social History:  reports that she has never smoked. She has never used smokeless tobacco. She reports that she does not drink alcohol and does not use drugs.  No Known Allergies  No family history on file.  Prior to Admission medications   Medication Sig Start Date End Date Taking? Authorizing Provider  acetaminophen (TYLENOL) 500 MG tablet Take by mouth.    [provider]  amLODipine (NORVASC) 10 MG tablet Take 10 mg by mouth every morning.    [provider]  aspirin 81 MG tablet Take 81 mg by mouth every morning.    [provider]  atorvastatin (LIPITOR) 10 MG tablet  05/08/17   [provider]  Calcium Carbonate-Vitamin D 600-400 MG-UNIT tablet Take by mouth.    [provider]  carvedilol (COREG) 3.125 MG tablet Take 3.125 mg by mouth 2 (two) times daily with a meal.    [provider]  clopidogrel (PLAVIX) 75 MG tablet TAKE 1 TABLET BY MOUTH ONCE DAILY. 10/31/22   Georgiana Spinner, NP  ergocalciferol (VITAMIN D2) 50000 UNITS capsule Take 50,000 Units by mouth once a week.    [provider]  fluticasone Aleda Grana) 50 MCG/ACT nasal spray Place into the nose. 08/23/18   [provider]  lisinopril (PRINIVIL,ZESTRIL) 20 MG tablet Take 20 mg by mouth at bedtime.    [provider]  lisinopril (  ZESTRIL) 40 MG tablet  03/10/20   [provider]  loperamide (IMODIUM A-D) 2 MG tablet Take 1 tablet (2 mg total) by mouth 4 (four) times daily as needed for diarrhea or loose stools. 09/01/16   Emily Filbert, MD  Multiple Vitamins-Minerals (ICAPS) CAPS Take 1 capsule by mouth 2 (two) times daily.    [provider]  nitroGLYCERIN (NITROSTAT) 0.4 MG SL tablet Place 0.4 mg under the tongue every 5 (five) minutes as needed for chest pain.    [provider]   ondansetron (ZOFRAN ODT) 4 MG disintegrating tablet Take 1 tablet (4 mg total) by mouth every 8 (eight) hours as needed for nausea or vomiting. 09/01/16   Emily Filbert, MD  pantoprazole (PROTONIX) 20 MG tablet Take 20 mg by mouth daily.    [provider]    Physical Exam: Vitals:   12/12/22 1900 12/12/22 2126 12/12/22 2200 12/12/22 2330  BP: (!) 158/64  (!) 160/62 (!) 161/72  Pulse: (!) 115  (!) 109 (!) 102  Resp: (!) 23  (!) 25 (!) 30  Temp:  98.7 F (37.1 C)    TempSrc:  Oral    SpO2: 92%  94% 96%  Weight:      Height:       Constitutional: Acute on chronic ill looking, NAD, calm, comfortable Eyes: PERRL, lids and conjunctivae normal ENMT: Mucous membranes are moist. Posterior pharynx clear of any exudate or lesions.Normal dentition.  Neck: normal, supple, no masses, no thyromegaly Respiratory: clear to auscultation bilaterally, no wheezing, no crackles. Normal respiratory effort. No accessory muscle use.  Cardiovascular: Sinus tachycardia, no murmurs / rubs / gallops. No extremity edema. 2+ pedal pulses. No carotid bruits.  Abdomen: no tenderness, no masses palpated. No hepatosplenomegaly. Bowel sounds positive.  Musculoskeletal: Good range of motion, no joint swelling or tenderness, Skin: no rashes, lesions, ulcers. No induration Neurologic: CN 2-12 grossly intact. Sensation intact, DTR normal. Strength 5/5 in all 4.  Psychiatric: Normal judgment and insight. Alert and oriented x 3. Normal mood  Data Reviewed:  Initial blood pressure 182/101, pulse is 112, respiratory rate of 25 oxygen sats 94% on 3 L which is new for patient.  Chest x-ray shows some mild pleural effusion.  CT angiogram of the chest showed no PE.  Mild to moderate posterior bilateral upper lobe lingula and bilateral lower scarring or atelectasis with small bilateral pleural effusion and hiatal hernia.  Adrenal gland enlargement.  CT abdomen pelvis showed only diverticular disease.  Assessment  and Plan:  #1 acute hypoxic respiratory failure: New onset hypoxia.  Patient will be admitted and now on oxygen.  Patient will be evaluated for possible cause.  BNP is pending.  COVID-19 testing will be recommended.  I urinalysis at this point is negative.  I will get echocardiogram.  #2 carotid artery stenosis: Patient is on Plavix and other medications.  Will continue.  #3 essential hypertension: Continue blood pressure management.  #4 chronic kidney disease stage III: Appears at baseline.  Continue to monitor  #5 generalized anxiety disorder: Will continue with home regimen.  #6 generalized debility: PT and OT consultation.    Advance Care Planning:   Code Status: Full Code   Consults: None  Family Communication: Sister at bedside  Severity of Illness: The appropriate patient status for this patient is INPATIENT. Inpatient status is judged to be reasonable and necessary in order to provide the required intensity of service to ensure the patient's safety. The patient's presenting symptoms,  physical exam findings, and initial radiographic and laboratory data in the context of their chronic comorbidities is felt to place them at high risk for further clinical deterioration. Furthermore, it is not anticipated that the patient will be medically stable for discharge from the hospital within 2 midnights of admission.   * I certify that at the point of admission it is my clinical judgment that the patient will require inpatient hospital care spanning beyond 2 midnights from the point of admission due to high intensity of service, high risk for further deterioration and high frequency of surveillance required.*  AuthorLonia Blood, MD 12/13/2022 12:50 AM  For on call review [email or web address].

## 2022-12-13 NOTE — Progress Notes (Signed)
This is a nonbillable note. Sharon Barajas is a 89 y.o. female with medical history significant of carotid artery stenosis, hypertension, hyperlipidemia, history of cholecystectomy, who presented to the ER with mainly abdominal pain.  Upon arriving the hospital, patient started having diarrhea.  C. difficile toxin sent out. Patient also developed significant hypoxemia, required 3 L oxygen.  BNP moderately elevated.  Echocardiogram performed 3 years ago showed ejection fraction more than 55%. On my examination, patient has crackles in the base.  Acute on chronic diastolic congestive heart failure. Acute respite failure secondary to congestive heart failure. Hypertension urgency. Patient was placed on IV Lasix twice a day, echocardiogram pending. Restarted most of blood pressure medicines, blood pressure better controlled.  Hypokalemia. Repleted.  Patient also has a mild elevation of procalcitonin level of 0.62, she just completed antibiotics for UTI.  I do not see additional infection, I will repeat a procalcitonin level tomorrow.

## 2022-12-14 ENCOUNTER — Inpatient Hospital Stay
Admit: 2022-12-14 | Discharge: 2022-12-14 | Disposition: A | Discharge status: Home / Self Care | Payer: Medicare HMO | Attending: Internal Medicine | Admitting: Internal Medicine

## 2022-12-14 ENCOUNTER — Inpatient Hospital Stay (HOSPITAL_COMMUNITY)
Admit: 2022-12-14 | Discharge: 2022-12-14 | Disposition: A | Discharge status: Home / Self Care | Payer: Medicare HMO | Attending: Internal Medicine | Admitting: Internal Medicine

## 2022-12-14 ENCOUNTER — Other Ambulatory Visit: Payer: Medicare HMO

## 2022-12-14 DIAGNOSIS — N179 Acute kidney failure, unspecified: Secondary | ICD-10-CM

## 2022-12-14 DIAGNOSIS — I5033 Acute on chronic diastolic (congestive) heart failure: Secondary | ICD-10-CM | POA: Diagnosis not present

## 2022-12-14 DIAGNOSIS — R197 Diarrhea, unspecified: Secondary | ICD-10-CM

## 2022-12-14 DIAGNOSIS — N189 Chronic kidney disease, unspecified: Secondary | ICD-10-CM

## 2022-12-14 DIAGNOSIS — I6523 Occlusion and stenosis of bilateral carotid arteries: Secondary | ICD-10-CM

## 2022-12-14 DIAGNOSIS — I10 Essential (primary) hypertension: Secondary | ICD-10-CM

## 2022-12-14 DIAGNOSIS — J9601 Acute respiratory failure with hypoxia: Secondary | ICD-10-CM | POA: Diagnosis not present

## 2022-12-14 DIAGNOSIS — E785 Hyperlipidemia, unspecified: Secondary | ICD-10-CM

## 2022-12-14 HISTORY — DX: Diarrhea, unspecified: R19.7

## 2022-12-14 LAB — GASTROINTESTINAL PANEL BY PCR, STOOL (REPLACES STOOL CULTURE)

## 2022-12-14 LAB — CBC
HCT: 34.5 % — ABNORMAL LOW (ref 36.0–46.0)
Hemoglobin: 10.7 g/dL — ABNORMAL LOW (ref 12.0–15.0)
MCH: 30.6 pg (ref 26.0–34.0)
MCHC: 31 g/dL (ref 30.0–36.0)
MCV: 98.6 fL (ref 80.0–100.0)
Platelets: 187 10*3/uL (ref 150–400)
RBC: 3.5 MIL/uL — ABNORMAL LOW (ref 3.87–5.11)
RDW: 13.5 % (ref 11.5–15.5)
WBC: 7 10*3/uL (ref 4.0–10.5)
nRBC: 0 % (ref 0.0–0.2)

## 2022-12-14 LAB — URINALYSIS, W/ REFLEX TO CULTURE (INFECTION SUSPECTED)
Bilirubin Urine: NEGATIVE
Glucose, UA: NEGATIVE mg/dL
Hgb urine dipstick: NEGATIVE
Ketones, ur: NEGATIVE mg/dL
Nitrite: NEGATIVE
Protein, ur: NEGATIVE mg/dL
Specific Gravity, Urine: 1.012 (ref 1.005–1.030)
pH: 5 (ref 5.0–8.0)

## 2022-12-14 LAB — ECHOCARDIOGRAM COMPLETE
AR max vel: 1.68 cm2
AV Area VTI: 1.66 cm2
AV Area mean vel: 1.68 cm2
AV Mean grad: 8.5 mmHg
AV Peak grad: 17.7 mmHg
Ao pk vel: 2.11 m/s
Area-P 1/2: 2.84 cm2
Height: 58 in
MV VTI: 2.13 cm2
S' Lateral: 2.3 cm
Weight: 1568 [oz_av]

## 2022-12-14 LAB — C DIFFICILE QUICK SCREEN W PCR REFLEX
C Diff antigen: NEGATIVE
C Diff interpretation: NOT DETECTED
C Diff toxin: NEGATIVE

## 2022-12-14 LAB — BASIC METABOLIC PANEL
Anion gap: 7 (ref 5–15)
BUN: 16 mg/dL (ref 8–23)
CO2: 26 mmol/L (ref 22–32)
Calcium: 8.4 mg/dL — ABNORMAL LOW (ref 8.9–10.3)
Chloride: 111 mmol/L (ref 98–111)
Creatinine, Ser: 1.4 mg/dL — ABNORMAL HIGH (ref 0.44–1.00)
GFR, Estimated: 36 mL/min — ABNORMAL LOW (ref >60)
Glucose, Bld: 87 mg/dL (ref 70–99)
Potassium: 4.6 mmol/L (ref 3.5–5.1)
Sodium: 144 mmol/L (ref 135–145)

## 2022-12-14 LAB — MAGNESIUM: Magnesium: 1.8 mg/dL (ref 1.7–2.4)

## 2022-12-14 LAB — PROCALCITONIN: Procalcitonin: 0.69 ng/mL

## 2022-12-14 LAB — PHOSPHORUS: Phosphorus: 2.9 mg/dL (ref 2.5–4.6)

## 2022-12-14 MED ORDER — ACETAMINOPHEN 325 MG PO TABS
650.0000 mg | ORAL_TABLET | Freq: Four times a day (QID) | ORAL | Status: DC | PRN
  Administered 2022-12-14: 650 mg via ORAL
  Filled 2022-12-14: qty 2

## 2022-12-14 MED ORDER — LOPERAMIDE HCL 2 MG PO CAPS: 2.0000 mg | ORAL_CAPSULE | Freq: Three times a day (TID) | ORAL | Status: DC | PRN

## 2022-12-14 MED ORDER — CHOLESTYRAMINE 4 G PO PACK
4.0000 g | PACK | Freq: Two times a day (BID) | ORAL | Status: DC
  Administered 2022-12-14 – 2022-12-16 (×4): 4 g via ORAL
  Filled 2022-12-14 (×4): qty 1

## 2022-12-14 NOTE — Assessment & Plan Note (Signed)
Patient on room air

## 2022-12-14 NOTE — Assessment & Plan Note (Signed)
On Lipitor 

## 2022-12-14 NOTE — Progress Notes (Signed)
*  PRELIMINARY RESULTS* Echocardiogram 2D Echocardiogram has been performed.  Carolyne Fiscal 12/14/2022, 2:29 PM

## 2022-12-14 NOTE — Plan of Care (Signed)

## 2022-12-14 NOTE — Progress Notes (Signed)
Progress Note   Patient: Sharon Barajas MVH:[id number] DOB: [birth year] DOA: 12/12/2022     1 DOS: the patient was seen and examined on 12/14/2022   Brief hospital course: 89 year old female with past medical history of peripheral vascular disease, hypertension, hyperlipidemia presents to the ER with some abdominal pain.  Patient states that she has had diarrhea for a long period of time.  Patient was recently treated with urinary tract infection.  Assessment and Plan: * Acute on chronic diastolic (congestive) heart failure (HCC) EF normal range.  Patient over diuresed with twice a day Lasix.  Hold Lasix today.  Acute hypoxemic respiratory failure (HCC) Patient was on 3 L this morning.  I do not see a documentation of hypoxia.  Pulse ox this afternoon on room air 99%.  Acute kidney injury superimposed on CKD (HCC) AKI on CKD stage IIIa.  Patient was given IV Lasix and overdiuresis.  Today's creatinine 1.4.  Diarrhea likely contributing.  Hold Lasix today.  Check creatinine again tomorrow.  Diarrhea Stool studies negative.  Will start cholestyramine since this problem happened after her gallbladder was removed.  Hyperlipidemia On Lipitor  Essential hypertension On Coreg  Carotid stenosis On Plavix        Subjective: Patient states she feels okay.  States she has diarrhea for a long time.  States her breathing is better.  Patient was on oxygen 3 L this morning.  Physical Exam: Vitals:   12/13/22 2259 12/14/22 0441 12/14/22 0810 12/14/22 1236  BP: (!) 123/45 (!) 138/52 130/67 132/66  Pulse: 79 65 72 68  Resp: 18 18 16  (!) 25  Temp: 97.7 F (36.5 C) 97.6 F (36.4 C) 98.6 F (37 C)   TempSrc:      SpO2: 98% 100% 99% 99%  Weight:      Height:       Physical Exam HENT:     Head: Normocephalic.     Mouth/Throat:     Pharynx: No oropharyngeal exudate.  Eyes:     General: Lids are normal.     Conjunctiva/sclera: Conjunctivae normal.  Cardiovascular:     Rate and  Rhythm: Normal rate and regular rhythm.     Heart sounds: Normal heart sounds, S1 normal and S2 normal.  Pulmonary:     Breath sounds: Examination of the right-lower field reveals decreased breath sounds. Examination of the left-lower field reveals decreased breath sounds. Decreased breath sounds present. No wheezing, rhonchi or rales.  Abdominal:     Palpations: Abdomen is soft.     Tenderness: There is no abdominal tenderness.  Musculoskeletal:     Right lower leg: No swelling.     Left lower leg: No swelling.  Skin:    General: Skin is warm.     Findings: No rash.  Neurological:     Mental Status: She is alert and oriented to person, place, and time.     Data Reviewed: Creatinine 1.4 procalcitonin 0.69, white blood cell count 7.0, hemoglobin 10.7, platelet count 187.  CT scan of the chest does not show pneumonia  Family Communication: Spoke with granddaughter on the phone  Disposition: Status is: Inpatient Remains inpatient appropriate because: Trial of cholestyramine for diarrhea.  Resend another urine analysis since patient had been treated for urinary tract infection as outpatient.  Holding off antibiotics currently with her borderline procalcitonin.  Watch creatinine again tomorrow with creatinine up at 1.4 with diuresis.  Planned Discharge Destination: Home    Time spent: 28 minutes  Author: Gerlene Burdock  Renae Gloss, MD 12/14/2022 3:45 PM  For on call review [email or web address].

## 2022-12-14 NOTE — Assessment & Plan Note (Signed)
On Coreg 

## 2022-12-14 NOTE — Hospital Course (Addendum)
89 year old female with past medical history of peripheral vascular disease, hypertension, hyperlipidemia presents to the ER with some abdominal pain.  Patient states that she has had diarrhea for a long period of time.  Patient was recently treated with urinary tract infection.  9/11.  Creatinine up to 1.4.  Lasix held.  Trial of cholestyramine for diarrhea 9/12.  Creatinine up to 1.67 will give 2 fluid boluses today. 9/13.  Creatinine down to 1.34.  Patient feeling well and wanting to go home.

## 2022-12-14 NOTE — Assessment & Plan Note (Addendum)
EF 60%.  Patient over diuresed with twice a day Lasix.  Held Lasix with creatinine rise.  The question is what to do as outpatient.  I will have her referred to the CHF clinic.  I will prescribe Lasix 20 mg daily with potassium starting in a couple days.  Need to check BMP and follow-up appointment.

## 2022-12-14 NOTE — Assessment & Plan Note (Signed)
On Plavix 

## 2022-12-14 NOTE — Assessment & Plan Note (Addendum)
Stool studies negative.  Continue trial of cholestyramine since this problem happened after her gallbladder was removed.  Cholestyramine is instead of the Imodium.

## 2022-12-14 NOTE — Assessment & Plan Note (Addendum)
AKI on CKD stage IIIa.  Secondary to overdiuresis and diarrhea.  Creatinine 1.67 on 9/12.  2 fluid boluses given.  Creatinine down to 1.34 upon discharge.

## 2022-12-14 NOTE — Evaluation (Addendum)
Physical Therapy Evaluation Patient Details Name: Sharon Barajas MRN: [id number] DOB: 1932-02-07 Today's Date: 12/14/2022  History of Present Illness  Pt is a 89 y/o female presenting dur to initial complaints of abdominal pain and dysuria. Further medical work up revealed hypoxemia and acute on chronic diastolic CHF and acute respiratory failure. PMH includes carotid artery stenosis, hypertension, GERD, hyperlipidemia, diverticulitis, and history of cholecystectomy.  Clinical Impression   Pt presents laying in bed, no complaints of pain. She currently lives alone in a house with a level entry. PTA she was ind/modi for ADLs and intermittently uses RW for ambulation (furniture walks otherwise).   Pt able to perform supine>sit with supervision, sit<>stand CGA/ RW, step pivot to recliner CGA/RW, and ambulated ~273ft CGA/RW.  Pt able to doff underwear without assistance but required modA from therapist to don a new pair. Overall standing balance was good and able to sustain single leg stance with minimal UE support. SPO2 remained >92% on RA throughout session. She would benefit from continued skilled therapy to maximize functional abilities.       If plan is discharge home, recommend the following: A little help with walking and/or transfers;A little help with bathing/dressing/bathroom;Assistance with cooking/housework;Direct supervision/assist for medications management;Help with stairs or ramp for entrance;Assist for transportation   Can travel by private vehicle        Equipment Recommendations None recommended by PT  Recommendations for Other Services       Functional Status Assessment Patient has had a recent decline in their functional status and demonstrates the ability to make significant improvements in function in a reasonable and predictable amount of time.     Precautions / Restrictions Precautions Precautions: Fall Restrictions Weight Bearing Restrictions: No       Mobility  Bed Mobility Overal bed mobility: Needs Assistance Bed Mobility: Supine to Sit     Supine to sit: Supervision, Used rails, HOB elevated          Transfers Overall transfer level: Needs assistance Equipment used: Rolling walker (2 wheels) Transfers: Sit to/from Stand, Bed to chair/wheelchair/BSC Sit to Stand: Contact guard assist   Step pivot transfers: Contact guard assist            Ambulation/Gait Ambulation/Gait assistance: Contact guard assist Gait Distance (Feet): 200 Feet Assistive device: Rolling walker (2 wheels) Gait Pattern/deviations: Decreased step length - right, Decreased step length - left, Step-through pattern Gait velocity: decreased        Stairs            Wheelchair Mobility     Tilt Bed    Modified Rankin (Stroke Patients Only)       Balance Overall balance assessment: Needs assistance Sitting-balance support: Bilateral upper extremity supported, Feet supported, Feet unsupported Sitting balance-Leahy Scale: Normal     Standing balance support: Bilateral upper extremity supported, During functional activity Standing balance-Leahy Scale: Good Standing balance comment: able to assist with donning/doffing brief without UE assist                             Pertinent Vitals/Pain Pain Assessment Pain Assessment: No/denies pain    Home Living Family/patient expects to be discharged to:: Private residence Living Arrangements: Alone Available Help at Discharge: Family Type of Home: House Home Access: Level entry       Home Layout: One level Home Equipment: Agricultural consultant (2 wheels)      Prior Function Prior Level of Function : Independent/Modified  Independent             Mobility Comments: intermittently uses RW for ambulation, furniture walks ADLs Comments: ind/modi with ADLs (cooking, cleaning, dressing, bathing)     Extremity/Trunk Assessment   Upper Extremity Assessment Upper  Extremity Assessment: Overall WFL for tasks assessed    Lower Extremity Assessment Lower Extremity Assessment: Generalized weakness       Communication   Communication Communication: Hearing impairment (HOH, R ear hears best) Cueing Techniques: Verbal cues;Gestural cues;Visual cues  Cognition Arousal: Alert Behavior During Therapy: WFL for tasks assessed/performed Overall Cognitive Status: Within Functional Limits for tasks assessed                                          General Comments General comments (skin integrity, edema, etc.): SPO2 remains >92% on RA throughout session    Exercises     Assessment/Plan    PT Assessment Patient needs continued PT services  PT Problem List Decreased strength;Decreased range of motion;Decreased activity tolerance;Decreased balance;Decreased mobility;Decreased knowledge of use of DME       PT Treatment Interventions DME instruction;Gait training;Functional mobility training;Stair training;Therapeutic activities;Therapeutic exercise;Balance training;Neuromuscular re-education;Patient/family education    PT Goals (Current goals can be found in the Care Plan section)  Acute Rehab PT Goals Patient Stated Goal: return home PT Goal Formulation: With patient Time For Goal Achievement: 12/28/22 Potential to Achieve Goals: Good    Frequency Min 1X/week     Co-evaluation               AM-PAC PT "6 Clicks" Mobility  Outcome Measure Help needed turning from your back to your side while in a flat bed without using bedrails?: A Little Help needed moving from lying on your back to sitting on the side of a flat bed without using bedrails?: A Little Help needed moving to and from a bed to a chair (including a wheelchair)?: A Little Help needed standing up from a chair using your arms (e.g., wheelchair or bedside chair)?: A Little Help needed to walk in hospital room?: A Little Help needed climbing 3-5 steps with a  railing? : A Little 6 Click Score: 18    End of Session Equipment Utilized During Treatment: Gait belt Activity Tolerance: Patient tolerated treatment well Patient left: in chair;with call bell/phone within reach;with chair alarm set   PT Visit Diagnosis: Other abnormalities of gait and mobility (R26.89);Muscle weakness (generalized) (M62.81);Difficulty in walking, not elsewhere classified (R26.2)    Time: 1441-1510 PT Time Calculation (min) (ACUTE ONLY): 29 min   Charges:   PT Evaluation $PT Eval Low Complexity: 1 Low PT Treatments $Therapeutic Activity: 23-37 mins PT General Charges $$ ACUTE PT VISIT: 1 Visit        Sascha Palma, PT, SPT 3:51 PM,12/14/22

## 2022-12-15 DIAGNOSIS — N179 Acute kidney failure, unspecified: Secondary | ICD-10-CM | POA: Diagnosis not present

## 2022-12-15 DIAGNOSIS — I5033 Acute on chronic diastolic (congestive) heart failure: Secondary | ICD-10-CM | POA: Diagnosis not present

## 2022-12-15 DIAGNOSIS — R197 Diarrhea, unspecified: Secondary | ICD-10-CM | POA: Diagnosis not present

## 2022-12-15 DIAGNOSIS — J9601 Acute respiratory failure with hypoxia: Secondary | ICD-10-CM | POA: Diagnosis not present

## 2022-12-15 LAB — BASIC METABOLIC PANEL
Anion gap: 13 (ref 5–15)
BUN: 21 mg/dL (ref 8–23)
CO2: 24 mmol/L (ref 22–32)
Calcium: 8.9 mg/dL (ref 8.9–10.3)
Chloride: 101 mmol/L (ref 98–111)
Creatinine, Ser: 1.67 mg/dL — ABNORMAL HIGH (ref 0.44–1.00)
GFR, Estimated: 29 mL/min — ABNORMAL LOW (ref >60)
Glucose, Bld: 169 mg/dL — ABNORMAL HIGH (ref 70–99)
Potassium: 4 mmol/L (ref 3.5–5.1)
Sodium: 138 mmol/L (ref 135–145)

## 2022-12-15 MED ORDER — SODIUM CHLORIDE 0.9 % IV BOLUS
250.0000 mL | Freq: Once | INTRAVENOUS | Status: AC
  Administered 2022-12-15: 250 mL via INTRAVENOUS

## 2022-12-15 MED ORDER — SODIUM CHLORIDE 0.9 % IV SOLN
1.0000 g | INTRAVENOUS | Status: DC
  Administered 2022-12-15 – 2022-12-16 (×2): 1 g via INTRAVENOUS
  Filled 2022-12-15 (×2): qty 10

## 2022-12-15 NOTE — Progress Notes (Signed)
  Progress Note   Patient: Sharon Barajas:[id number] DOB: Jun 02, 1931 DOA: 12/12/2022     2 DOS: the patient was seen and examined on 12/15/2022   Brief hospital course: 89 year old female with past medical history of peripheral vascular disease, hypertension, hyperlipidemia presents to the ER with some abdominal pain.  Patient states that she has had diarrhea for a long period of time.  Patient was recently treated with urinary tract infection.  9/11.  Creatinine up to 1.4.  Lasix held.  Trial of cholestyramine for diarrhea 9/12.  Creatinine up to 1.67 will give 2 fluid boluses today.  Assessment and Plan: * Acute kidney injury superimposed on CKD (HCC) AKI on CKD stage IIIa.  Secondary to overdiuresis and diarrhea.  Creatinine 1.67 today.  Will give 2 fluid boluses today and recheck creatinine tomorrow.  Acute on chronic diastolic (congestive) heart failure (HCC) EF 60%.  Patient over diuresed with twice a day Lasix.  Held Lasix since yesterday when I took over the case.  Will give 2 fluid boluses today and reassess tomorrow.  Acute hypoxemic respiratory failure [facility]) Patient on room air.  Diarrhea Stool studies negative.  Continue trial of cholestyramine since this problem happened after her gallbladder was removed.  Hyperlipidemia On Lipitor  Essential hypertension On Coreg  Carotid stenosis On Plavix        Subjective: Patient feeling okay today.  States that she takes a lot of Imodium at home for diarrhea.  States diarrhea is a little bit less today.  Feels okay.  Not complaining of shortness of breath.  Physical Exam: Vitals:   12/14/22 2320 12/15/22 0337 12/15/22 0752 12/15/22 1153  BP: (!) 124/52 118/62 134/71 (!) 113/53  Pulse: 77 73 83 78  Resp: 18 18 16 16   Temp: 98.7 F (37.1 C) 98.2 F (36.8 C) 98.6 F (37 C) 98.5 F (36.9 C)  TempSrc: Oral  Oral Oral  SpO2: 93% 93% 93% 96%  Weight:      Height:       Physical Exam HENT:     Head:  Normocephalic.     Mouth/Throat:     Pharynx: No oropharyngeal exudate.  Eyes:     General: Lids are normal.     Conjunctiva/sclera: Conjunctivae normal.  Cardiovascular:     Rate and Rhythm: Normal rate and regular rhythm.     Heart sounds: Normal heart sounds, S1 normal and S2 normal.  Pulmonary:     Breath sounds: Examination of the right-lower field reveals decreased breath sounds. Examination of the left-lower field reveals decreased breath sounds. Decreased breath sounds present. No wheezing, rhonchi or rales.  Abdominal:     Palpations: Abdomen is soft.     Tenderness: There is no abdominal tenderness.  Musculoskeletal:     Right lower leg: No swelling.     Left lower leg: No swelling.  Skin:    General: Skin is warm.     Findings: No rash.  Neurological:     Mental Status: She is alert and oriented to person, place, and time.     Data Reviewed: Creatinine 1.67  Family Communication: Spoke with granddaughter on the phone  Disposition: Status is: Inpatient Remains inpatient appropriate because: Will give 2 fluid boluses today with overdiuresis.  Planned Discharge Destination: Home    Time spent: 28 minutes  Author: Alford Highland, MD 12/15/2022 1:41 PM  For on call review [email or web address].

## 2022-12-15 NOTE — Plan of Care (Signed)

## 2022-12-15 NOTE — TOC Initial Note (Signed)
Transition of Care [facility]) - Initial/Assessment Note    Patient Details  Name: Sharon Barajas MRN: [id number] Date of Birth: [birth year]  Transition of Care [facility]) CM/SW Contact:    Truddie Hidden, RN Phone Number: 12/15/2022, 2:02 PM  Clinical Narrative:                 Spoke with patient at the bedside regarding therapy's recommendation for [facility] PT/OT. She is agreeable and does not have a preference. RNCM spoke with patient's granddaughter to updated about HH. She was advised family will be notified by accepting agency to schedule SOC at discharge. Patient granddaugter will transport her home when discharged.    Referral  sent to Coralee North from [facility].        Patient Goals and CMS Choice            Expected Discharge Plan and Services                                              Prior Living Arrangements/Services                       Activities of Daily Living Home Assistive Devices/Equipment: None ADL Screening (condition at time of admission) Patient's cognitive ability adequate to safely complete daily activities?: Yes Is the patient deaf or have difficulty hearing?: Yes Does the patient have difficulty seeing, even when wearing glasses/contacts?: Yes Does the patient have difficulty concentrating, remembering, or making decisions?: Yes Patient able to express need for assistance with ADLs?: Yes Does the patient have difficulty dressing or bathing?: Yes Independently performs ADLs?: Yes (appropriate for developmental age) Does the patient have difficulty walking or climbing stairs?: Yes Weakness of Legs: Both Weakness of Arms/Hands: Both  Permission Sought/Granted                  Emotional Assessment              Admission diagnosis:  Abnormal EKG [R94.31] Elevated troponin [R79.89] Acute hypoxemic respiratory failure (HCC) [J96.01] Left lower quadrant abdominal pain [R10.32] Patient Active Problem List   Diagnosis Date Noted    Diarrhea 12/14/2022   Acute hypoxemic respiratory failure (HCC) 12/13/2022   Acute on chronic diastolic (congestive) heart failure (HCC) 12/13/2022   GAD (generalized anxiety disorder) 08/18/2020   Gastroesophageal reflux disease without esophagitis 09/13/2019   History of vitamin D deficiency 09/13/2019   Other osteoporosis without current pathological fracture 09/13/2019   Senile purpura (HCC) 08/06/2018   Community acquired pneumonia 04/29/2018   Acute kidney injury superimposed on CKD (HCC) 04/29/2018   Elevated troponin 04/29/2018   Pneumonia 04/29/2018   Essential hypertension 07/26/2017   Hyperlipidemia 07/26/2017   Encounter for general adult medical examination without abnormal findings 09/27/2016   Seasonal allergic rhinitis due to pollen 09/27/2016   Moderate mitral insufficiency 01/07/2016   Vaccine counseling 11/23/2015   Carotid stenosis 12/17/2014   Bilateral carotid artery stenosis 10/17/2014   H/O adenomatous polyp of colon 10/22/2013   PCP:  [name], MD Pharmacy:   [facility] - [place], [place] - [address] [place] [address] Phone: [phone] Fax: [phone]  [facility] - [place], [place] - [address] [place] [address] Phone: [phone] Fax: [phone]     Social Determinants of Health (SDOH) Social History: SDOH  Screenings   Food Insecurity: No Food Insecurity (12/13/2022)  Housing: Low Risk  (12/13/2022)  Transportation Needs: No Transportation Needs (12/13/2022)  Utilities: Not At Risk (12/13/2022)  Financial Resource Strain: Low Risk  (11/16/2022)   Received from [facility]  Tobacco Use: Low Risk  (12/13/2022)   SDOH Interventions:     Readmission Risk Interventions     No data to display

## 2022-12-15 NOTE — Plan of Care (Signed)
  Problem: Education: Goal: Knowledge of General Education information will improve Description Including pain rating scale, medication(s)/side effects and non-pharmacologic comfort measures Outcome: Progressing   

## 2022-12-15 NOTE — Plan of Care (Signed)
  Problem: Education: Goal: Knowledge of General Education information will improve Description: Including pain rating scale, medication(s)/side effects and non-pharmacologic comfort measures 12/15/2022 0701 by Loraine Maple, RN Outcome: Progressing 12/15/2022 0701 by Loraine Maple, RN Outcome: Progressing

## 2022-12-15 NOTE — Evaluation (Signed)
Occupational Therapy Evaluation Patient Details Name: Sharon Barajas MRN: [id number] DOB: 12/16/1931 Today's Date: 12/15/2022   History of Present Illness Pt is a 89 y/o female presenting dur to initial complaints of abdominal pain and dysuria. Further medical work up revealed hypoxemia and acute on chronic diastolic CHF and acute respiratory failure. PMH includes carotid artery stenosis, hypertension, GERD, hyperlipidemia, diverticulitis, and history of cholecystectomy.   Clinical Impression   Pt received in recliner, pleasant and agreeable to OT eval. Denies pain or SOB. MD stopping in during eval. Pt lives alone with her sister next door, is IND with ADL performance and uses a RW for mobility. She denies recent falls. Grandchildren provide support for meals, and med mgmt. Pt endorses visual impairments, and no longer drives. During OT eval, ADLs performed at sink in standing with supervision, occasional vc to locate items d/t vision. Completes mobility in room with supervision/cga and RW, transfers CGA progressing to supervision. Pt demos good balance, stepping forwards/backwards and laterally without LOB. Patient will benefit from acute OT to increase overall independence in the areas of ADLs and functional mobility in order to safely discharge.       If plan is discharge home, recommend the following: A little help with walking and/or transfers;A little help with bathing/dressing/bathroom;Assistance with cooking/housework;Direct supervision/assist for medications management;Direct supervision/assist for financial management    Functional Status Assessment  Patient has had a recent decline in their functional status and demonstrates the ability to make significant improvements in function in a reasonable and predictable amount of time.  Equipment Recommendations  None recommended by OT    Recommendations for Other Services Other (comment)     Precautions / Restrictions  Precautions Precautions: Fall Restrictions Weight Bearing Restrictions: No      Mobility Bed Mobility               General bed mobility comments: NT    Transfers Overall transfer level: Needs assistance Equipment used: Rolling walker (2 wheels) Transfers: Sit to/from Stand Sit to Stand: Contact guard assist, Supervision           General transfer comment: first STS CGA, second supervision      Balance Overall balance assessment: Needs assistance Sitting-balance support: Feet supported, No upper extremity supported       Standing balance support: No upper extremity supported, During functional activity Standing balance-Leahy Scale: Good                             ADL either performed or assessed with clinical judgement   ADL Overall ADL's : Needs assistance/impaired     Grooming: Wash/dry hands;Oral care;Wash/dry face;Applying deodorant;Supervision/safety;Standing Grooming Details (indicate cue type and reason): standing at sink Upper Body Bathing: Supervision/ safety;Standing Upper Body Bathing Details (indicate cue type and reason): assist only for gown snaps/IV mgmt             Toilet Transfer: Supervision/safety;Contact guard assist;Rolling walker (2 wheels) Toilet Transfer Details (indicate cue type and reason): simulated from recliner         Functional mobility during ADLs: Rolling walker (2 wheels) General ADL Comments: Pt functionally limited by visual impairments at baseline. Steady dynamic balance in standing while performing ADLs at sink, used RW for mobility with supervision/CGA.     Vision Ability to See in Adequate Light: 2 Moderately impaired Patient Visual Report: Other (comment);No change from baseline (vision impairments at baseline)  Pertinent Vitals/Pain Pain Assessment Pain Assessment: No/denies pain        Communication Communication Communication: Hearing impairment (HOH, R ear hears  best)   Cognition Arousal: Alert Behavior During Therapy: WFL for tasks assessed/performed Overall Cognitive Status: Within Functional Limits for tasks assessed                                       General Comments  SpO2 >92% on RA            Home Living Family/patient expects to be discharged to:: Private residence Living Arrangements: Alone Available Help at Discharge: Family Type of Home: House Home Access: Level entry     Home Layout: One level     Bathroom Shower/Tub: Producer, television/film/video: Standard     Home Equipment: Agricultural consultant (2 wheels);Shower seat   Additional Comments: sister lives nearby, grandchildren bring her food every Sunday      Prior Functioning/Environment Prior Level of Function : Independent/Modified Independent             Mobility Comments: intermittently uses RW for ambulation, furniture walks ADLs Comments: ind/modi with ADLs (cooking, cleaning, dressing, bathing). does not drive        OT Problem List: Decreased activity tolerance;Impaired vision/perception;Impaired balance (sitting and/or standing)      OT Treatment/Interventions: Self-care/ADL training;Visual/perceptual remediation/compensation;Patient/family education;Balance training;Therapeutic exercise;Energy conservation;Therapeutic activities    OT Goals(Current goals can be found in the care plan section) Acute Rehab OT Goals Patient Stated Goal: to go home OT Goal Formulation: With patient Time For Goal Achievement: 12/29/22 Potential to Achieve Goals: Good  OT Frequency: Min 1X/week    Co-evaluation              AM-PAC OT "6 Clicks" Daily Activity     Outcome Measure Help from another person eating meals?: None Help from another person taking care of personal grooming?: A Little Help from another person toileting, which includes using toliet, bedpan, or urinal?: A Little Help from another person bathing (including washing,  rinsing, drying)?: A Little Help from another person to put on and taking off regular upper body clothing?: A Little Help from another person to put on and taking off regular lower body clothing?: A Little 6 Click Score: 19   End of Session Equipment Utilized During Treatment: Gait belt;Rolling walker (2 wheels) Nurse Communication: Other (comment)  Activity Tolerance: Patient tolerated treatment well Patient left: with call bell/phone within reach;in chair;with chair alarm set  OT Visit Diagnosis: Unsteadiness on feet (R26.81);Other abnormalities of gait and mobility (R26.89)                Time: 2952-8413 OT Time Calculation (min): 30 min Charges:  OT General Charges $OT Visit: 1 Visit OT Evaluation $OT Eval Low Complexity: 1 Low OT Treatments $Self Care/Home Management : 8-22 mins  Jolaine Fryberger L. Andrewjames Weirauch, OTR/L  12/15/22, 11:01 AM

## 2022-12-16 DIAGNOSIS — J9601 Acute respiratory failure with hypoxia: Secondary | ICD-10-CM | POA: Diagnosis not present

## 2022-12-16 DIAGNOSIS — R197 Diarrhea, unspecified: Secondary | ICD-10-CM | POA: Diagnosis not present

## 2022-12-16 DIAGNOSIS — N179 Acute kidney failure, unspecified: Secondary | ICD-10-CM | POA: Diagnosis not present

## 2022-12-16 DIAGNOSIS — I5033 Acute on chronic diastolic (congestive) heart failure: Secondary | ICD-10-CM | POA: Diagnosis not present

## 2022-12-16 LAB — URINE CULTURE: Culture: <10000 — AB

## 2022-12-16 LAB — BASIC METABOLIC PANEL
Anion gap: 11 (ref 5–15)
BUN: 19 mg/dL (ref 8–23)
CO2: 24 mmol/L (ref 22–32)
Calcium: 8.9 mg/dL (ref 8.9–10.3)
Chloride: 107 mmol/L (ref 98–111)
Creatinine, Ser: 1.34 mg/dL — ABNORMAL HIGH (ref 0.44–1.00)
GFR, Estimated: 38 mL/min — ABNORMAL LOW (ref >60)
Glucose, Bld: 94 mg/dL (ref 70–99)
Potassium: 4.2 mmol/L (ref 3.5–5.1)
Sodium: 142 mmol/L (ref 135–145)

## 2022-12-16 MED ORDER — POTASSIUM CHLORIDE CRYS ER 10 MEQ PO TBCR: 10.0000 meq | EXTENDED_RELEASE_TABLET | Freq: Every day | ORAL | 0 refills | Status: DC

## 2022-12-16 MED ORDER — FUROSEMIDE 20 MG PO TABS
20.0000 mg | ORAL_TABLET | Freq: Every day | ORAL | 0 refills | Status: DC
Start: 2022-12-18 — End: 2023-02-21

## 2022-12-16 MED ORDER — CHOLESTYRAMINE 4 G PO PACK: 4.0000 g | PACK | Freq: Two times a day (BID) | ORAL | 0 refills | Status: AC

## 2022-12-16 MED ORDER — CARVEDILOL 3.125 MG PO TABS: 3.1250 mg | ORAL_TABLET | Freq: Two times a day (BID) | ORAL | 0 refills | Status: DC

## 2022-12-16 MED ORDER — CEFDINIR 300 MG PO CAPS: 300.0000 mg | ORAL_CAPSULE | Freq: Two times a day (BID) | ORAL | Status: DC

## 2022-12-16 NOTE — Discharge Summary (Signed)
meal.   carvedilol 3.125 MG tablet Commonly known as: COREG Take 1 tablet (3.125 mg total) by mouth 2 (two) times daily with a meal. What changed:  medication strength how much to take   cefdinir 300 MG capsule Commonly known as: OMNICEF Take 1 capsule (300 mg total) by mouth 2 (two) times daily. Start taking on: December 17, 2022   cholestyramine 4 g packet Commonly known as: QUESTRAN Take 1 packet (4 g  total) by mouth 2 (two) times daily.   clopidogrel 75 MG tablet Commonly known as: PLAVIX TAKE 1 TABLET BY MOUTH ONCE DAILY.   escitalopram 10 MG tablet Commonly known as: LEXAPRO Take 1 tablet by mouth daily.   furosemide 20 MG tablet Commonly known as: Lasix Take 1 tablet (20 mg total) by mouth daily. Start taking on: December 18, 2022   hydrALAZINE 25 MG tablet Commonly known as: APRESOLINE Take 25 mg by mouth 3 (three) times daily.   ICaps Caps Take 1 capsule by mouth 2 (two) times daily.   nitroGLYCERIN 0.4 MG SL tablet Commonly known as: NITROSTAT Place 0.4 mg under the tongue every 5 (five) minutes as needed for chest pain.   pantoprazole 20 MG tablet Commonly known as: PROTONIX Take 20 mg by mouth 2 (two) times daily.   potassium chloride 10 MEQ tablet Commonly known as: KLOR-CON M Take 1 tablet (10 mEq total) by mouth daily.        Follow-up Information     [name], MD. Go in 5 day(s).   Specialty: Family Medicine Why: Appointment on Tuesday, 12/20/2022 at 11:30am. Contact information: [address] [place] [address] [phone]                Discharge Exam: Filed Weights   12/12/22 1528  Weight: 44.5 kg   Physical Exam HENT:     Head: Normocephalic.     Mouth/Throat:     Pharynx: No oropharyngeal exudate.  Eyes:     General: Lids are normal.     Conjunctiva/sclera: Conjunctivae normal.  Cardiovascular:     Rate and Rhythm: Normal rate and regular rhythm.     Heart sounds: Normal heart sounds, S1 normal and S2 normal.  Pulmonary:     Breath sounds: Examination of the right-lower field reveals decreased breath sounds. Examination of the left-lower field reveals decreased breath sounds. Decreased breath sounds present. No wheezing, rhonchi or rales.  Abdominal:     Palpations: Abdomen is soft.     Tenderness: There is no abdominal tenderness.  Musculoskeletal:     Right lower leg: No swelling.     Left lower  leg: No swelling.  Skin:    General: Skin is warm.     Findings: No rash.  Neurological:     Mental Status: She is alert and oriented to person, place, and time.      Condition at discharge: stable  The results of significant diagnostics from this hospitalization (including imaging, microbiology, ancillary and laboratory) are listed below for reference.   Imaging Studies: ECHOCARDIOGRAM COMPLETE  Result Date: 12/14/2022    ECHOCARDIOGRAM REPORT   Patient Name:   Sharon Barajas Date of Exam: 12/14/2022 Medical Rec #:  [id number]         Height:       58.0 in Accession #:    [id number]        Weight:       98.0 lb Date of Birth:  [birth year]  Physician Discharge Summary   Patient: Sharon Barajas MRN: [id number] DOB: [birth year]  Admit date:     12/12/2022  Discharge date: 12/16/22  Discharge Physician: Alford Highland   PCP: [name], MD   Recommendations at discharge:   Follow-up PCP 5 days Follow-up CHF clinic  Discharge Diagnoses: Principal Problem:   Acute kidney injury superimposed on CKD (HCC) Active Problems:   Acute hypoxemic respiratory failure (HCC)   Acute on chronic diastolic (congestive) heart failure (HCC)   Diarrhea   Carotid stenosis   Essential hypertension   Hyperlipidemia   Bilateral carotid artery stenosis   Gastroesophageal reflux disease without esophagitis   GAD (generalized anxiety disorder)    Hospital Course: 89 year old female with past medical history of peripheral vascular disease, hypertension, hyperlipidemia presents to the ER with some abdominal pain.  Patient states that she has had diarrhea for a long period of time.  Patient was recently treated with urinary tract infection.  9/11.  Creatinine up to 1.4.  Lasix held.  Trial of cholestyramine for diarrhea 9/12.  Creatinine up to 1.67 will give 2 fluid boluses today. 9/13.  Creatinine down to 1.34.  Patient feeling well and wanting to go home.  Assessment and Plan: * Acute kidney injury superimposed on CKD (HCC) AKI on CKD stage IIIa.  Secondary to overdiuresis and diarrhea.  Creatinine 1.67 on 9/12.  2 fluid boluses given.  Creatinine down to 1.34 upon discharge.  Acute on chronic diastolic (congestive) heart failure (HCC) EF 60%.  Patient over diuresed with twice a day Lasix.  Held Lasix with creatinine rise.  The question is what to do as outpatient.  I will have her referred to the CHF clinic.  I will prescribe Lasix 20 mg daily with potassium starting in a couple days.  Need to check BMP and follow-up appointment.  Acute hypoxemic respiratory failure [facility]) Patient on room air.  Diarrhea Stool studies  negative.  Continue trial of cholestyramine since this problem happened after her gallbladder was removed.  Cholestyramine is instead of the Imodium.  Hyperlipidemia On Lipitor  Essential hypertension On Coreg  Carotid stenosis On Plavix         Consultants: None Procedures performed: None Disposition: Home health Diet recommendation:  Cardiac diet DISCHARGE MEDICATION: Allergies as of 12/16/2022   No Known Allergies      Medication List     STOP taking these medications    aspirin 81 MG tablet   ergocalciferol 1.25 MG (50000 UT) capsule Commonly known as: VITAMIN D2   fluticasone 50 MCG/ACT nasal spray Commonly known as: FLONASE   lisinopril 20 MG tablet Commonly known as: ZESTRIL   lisinopril 40 MG tablet Commonly known as: ZESTRIL   loperamide 2 MG tablet Commonly known as: IMODIUM A-D   ondansetron 4 MG disintegrating tablet Commonly known as: Zofran ODT       TAKE these medications    acetaminophen 325 MG tablet Commonly known as: TYLENOL Take 650 mg by mouth every 8 (eight) hours as needed for mild pain.   albuterol 108 (90 Base) MCG/ACT inhaler Commonly known as: VENTOLIN HFA Inhale 2 puffs into the lungs every 6 (six) hours as needed for wheezing.   amLODipine 10 MG tablet Commonly known as: NORVASC Take 10 mg by mouth every morning.   atorvastatin 10 MG tablet Commonly known as: LIPITOR Take 10 mg by mouth daily.   Calcium Carbonate-Vitamin D 600-400 MG-UNIT tablet Take 1 tablet by mouth 2 (two) times daily with a  Physician Discharge Summary   Patient: Sharon Barajas MRN: [id number] DOB: [birth year]  Admit date:     12/12/2022  Discharge date: 12/16/22  Discharge Physician: Alford Highland   PCP: [name], MD   Recommendations at discharge:   Follow-up PCP 5 days Follow-up CHF clinic  Discharge Diagnoses: Principal Problem:   Acute kidney injury superimposed on CKD (HCC) Active Problems:   Acute hypoxemic respiratory failure (HCC)   Acute on chronic diastolic (congestive) heart failure (HCC)   Diarrhea   Carotid stenosis   Essential hypertension   Hyperlipidemia   Bilateral carotid artery stenosis   Gastroesophageal reflux disease without esophagitis   GAD (generalized anxiety disorder)    Hospital Course: 89 year old female with past medical history of peripheral vascular disease, hypertension, hyperlipidemia presents to the ER with some abdominal pain.  Patient states that she has had diarrhea for a long period of time.  Patient was recently treated with urinary tract infection.  9/11.  Creatinine up to 1.4.  Lasix held.  Trial of cholestyramine for diarrhea 9/12.  Creatinine up to 1.67 will give 2 fluid boluses today. 9/13.  Creatinine down to 1.34.  Patient feeling well and wanting to go home.  Assessment and Plan: * Acute kidney injury superimposed on CKD (HCC) AKI on CKD stage IIIa.  Secondary to overdiuresis and diarrhea.  Creatinine 1.67 on 9/12.  2 fluid boluses given.  Creatinine down to 1.34 upon discharge.  Acute on chronic diastolic (congestive) heart failure (HCC) EF 60%.  Patient over diuresed with twice a day Lasix.  Held Lasix with creatinine rise.  The question is what to do as outpatient.  I will have her referred to the CHF clinic.  I will prescribe Lasix 20 mg daily with potassium starting in a couple days.  Need to check BMP and follow-up appointment.  Acute hypoxemic respiratory failure [facility]) Patient on room air.  Diarrhea Stool studies  negative.  Continue trial of cholestyramine since this problem happened after her gallbladder was removed.  Cholestyramine is instead of the Imodium.  Hyperlipidemia On Lipitor  Essential hypertension On Coreg  Carotid stenosis On Plavix         Consultants: None Procedures performed: None Disposition: Home health Diet recommendation:  Cardiac diet DISCHARGE MEDICATION: Allergies as of 12/16/2022   No Known Allergies      Medication List     STOP taking these medications    aspirin 81 MG tablet   ergocalciferol 1.25 MG (50000 UT) capsule Commonly known as: VITAMIN D2   fluticasone 50 MCG/ACT nasal spray Commonly known as: FLONASE   lisinopril 20 MG tablet Commonly known as: ZESTRIL   lisinopril 40 MG tablet Commonly known as: ZESTRIL   loperamide 2 MG tablet Commonly known as: IMODIUM A-D   ondansetron 4 MG disintegrating tablet Commonly known as: Zofran ODT       TAKE these medications    acetaminophen 325 MG tablet Commonly known as: TYLENOL Take 650 mg by mouth every 8 (eight) hours as needed for mild pain.   albuterol 108 (90 Base) MCG/ACT inhaler Commonly known as: VENTOLIN HFA Inhale 2 puffs into the lungs every 6 (six) hours as needed for wheezing.   amLODipine 10 MG tablet Commonly known as: NORVASC Take 10 mg by mouth every morning.   atorvastatin 10 MG tablet Commonly known as: LIPITOR Take 10 mg by mouth daily.   Calcium Carbonate-Vitamin D 600-400 MG-UNIT tablet Take 1 tablet by mouth 2 (two) times daily with a  Physician Discharge Summary   Patient: Sharon Barajas MRN: [id number] DOB: [birth year]  Admit date:     12/12/2022  Discharge date: 12/16/22  Discharge Physician: Alford Highland   PCP: [name], MD   Recommendations at discharge:   Follow-up PCP 5 days Follow-up CHF clinic  Discharge Diagnoses: Principal Problem:   Acute kidney injury superimposed on CKD (HCC) Active Problems:   Acute hypoxemic respiratory failure (HCC)   Acute on chronic diastolic (congestive) heart failure (HCC)   Diarrhea   Carotid stenosis   Essential hypertension   Hyperlipidemia   Bilateral carotid artery stenosis   Gastroesophageal reflux disease without esophagitis   GAD (generalized anxiety disorder)    Hospital Course: 89 year old female with past medical history of peripheral vascular disease, hypertension, hyperlipidemia presents to the ER with some abdominal pain.  Patient states that she has had diarrhea for a long period of time.  Patient was recently treated with urinary tract infection.  9/11.  Creatinine up to 1.4.  Lasix held.  Trial of cholestyramine for diarrhea 9/12.  Creatinine up to 1.67 will give 2 fluid boluses today. 9/13.  Creatinine down to 1.34.  Patient feeling well and wanting to go home.  Assessment and Plan: * Acute kidney injury superimposed on CKD (HCC) AKI on CKD stage IIIa.  Secondary to overdiuresis and diarrhea.  Creatinine 1.67 on 9/12.  2 fluid boluses given.  Creatinine down to 1.34 upon discharge.  Acute on chronic diastolic (congestive) heart failure (HCC) EF 60%.  Patient over diuresed with twice a day Lasix.  Held Lasix with creatinine rise.  The question is what to do as outpatient.  I will have her referred to the CHF clinic.  I will prescribe Lasix 20 mg daily with potassium starting in a couple days.  Need to check BMP and follow-up appointment.  Acute hypoxemic respiratory failure [facility]) Patient on room air.  Diarrhea Stool studies  negative.  Continue trial of cholestyramine since this problem happened after her gallbladder was removed.  Cholestyramine is instead of the Imodium.  Hyperlipidemia On Lipitor  Essential hypertension On Coreg  Carotid stenosis On Plavix         Consultants: None Procedures performed: None Disposition: Home health Diet recommendation:  Cardiac diet DISCHARGE MEDICATION: Allergies as of 12/16/2022   No Known Allergies      Medication List     STOP taking these medications    aspirin 81 MG tablet   ergocalciferol 1.25 MG (50000 UT) capsule Commonly known as: VITAMIN D2   fluticasone 50 MCG/ACT nasal spray Commonly known as: FLONASE   lisinopril 20 MG tablet Commonly known as: ZESTRIL   lisinopril 40 MG tablet Commonly known as: ZESTRIL   loperamide 2 MG tablet Commonly known as: IMODIUM A-D   ondansetron 4 MG disintegrating tablet Commonly known as: Zofran ODT       TAKE these medications    acetaminophen 325 MG tablet Commonly known as: TYLENOL Take 650 mg by mouth every 8 (eight) hours as needed for mild pain.   albuterol 108 (90 Base) MCG/ACT inhaler Commonly known as: VENTOLIN HFA Inhale 2 puffs into the lungs every 6 (six) hours as needed for wheezing.   amLODipine 10 MG tablet Commonly known as: NORVASC Take 10 mg by mouth every morning.   atorvastatin 10 MG tablet Commonly known as: LIPITOR Take 10 mg by mouth daily.   Calcium Carbonate-Vitamin D 600-400 MG-UNIT tablet Take 1 tablet by mouth 2 (two) times daily with a  Physician Discharge Summary   Patient: Sharon Barajas MRN: [id number] DOB: [birth year]  Admit date:     12/12/2022  Discharge date: 12/16/22  Discharge Physician: Alford Highland   PCP: [name], MD   Recommendations at discharge:   Follow-up PCP 5 days Follow-up CHF clinic  Discharge Diagnoses: Principal Problem:   Acute kidney injury superimposed on CKD (HCC) Active Problems:   Acute hypoxemic respiratory failure (HCC)   Acute on chronic diastolic (congestive) heart failure (HCC)   Diarrhea   Carotid stenosis   Essential hypertension   Hyperlipidemia   Bilateral carotid artery stenosis   Gastroesophageal reflux disease without esophagitis   GAD (generalized anxiety disorder)    Hospital Course: 89 year old female with past medical history of peripheral vascular disease, hypertension, hyperlipidemia presents to the ER with some abdominal pain.  Patient states that she has had diarrhea for a long period of time.  Patient was recently treated with urinary tract infection.  9/11.  Creatinine up to 1.4.  Lasix held.  Trial of cholestyramine for diarrhea 9/12.  Creatinine up to 1.67 will give 2 fluid boluses today. 9/13.  Creatinine down to 1.34.  Patient feeling well and wanting to go home.  Assessment and Plan: * Acute kidney injury superimposed on CKD (HCC) AKI on CKD stage IIIa.  Secondary to overdiuresis and diarrhea.  Creatinine 1.67 on 9/12.  2 fluid boluses given.  Creatinine down to 1.34 upon discharge.  Acute on chronic diastolic (congestive) heart failure (HCC) EF 60%.  Patient over diuresed with twice a day Lasix.  Held Lasix with creatinine rise.  The question is what to do as outpatient.  I will have her referred to the CHF clinic.  I will prescribe Lasix 20 mg daily with potassium starting in a couple days.  Need to check BMP and follow-up appointment.  Acute hypoxemic respiratory failure [facility]) Patient on room air.  Diarrhea Stool studies  negative.  Continue trial of cholestyramine since this problem happened after her gallbladder was removed.  Cholestyramine is instead of the Imodium.  Hyperlipidemia On Lipitor  Essential hypertension On Coreg  Carotid stenosis On Plavix         Consultants: None Procedures performed: None Disposition: Home health Diet recommendation:  Cardiac diet DISCHARGE MEDICATION: Allergies as of 12/16/2022   No Known Allergies      Medication List     STOP taking these medications    aspirin 81 MG tablet   ergocalciferol 1.25 MG (50000 UT) capsule Commonly known as: VITAMIN D2   fluticasone 50 MCG/ACT nasal spray Commonly known as: FLONASE   lisinopril 20 MG tablet Commonly known as: ZESTRIL   lisinopril 40 MG tablet Commonly known as: ZESTRIL   loperamide 2 MG tablet Commonly known as: IMODIUM A-D   ondansetron 4 MG disintegrating tablet Commonly known as: Zofran ODT       TAKE these medications    acetaminophen 325 MG tablet Commonly known as: TYLENOL Take 650 mg by mouth every 8 (eight) hours as needed for mild pain.   albuterol 108 (90 Base) MCG/ACT inhaler Commonly known as: VENTOLIN HFA Inhale 2 puffs into the lungs every 6 (six) hours as needed for wheezing.   amLODipine 10 MG tablet Commonly known as: NORVASC Take 10 mg by mouth every morning.   atorvastatin 10 MG tablet Commonly known as: LIPITOR Take 10 mg by mouth daily.   Calcium Carbonate-Vitamin D 600-400 MG-UNIT tablet Take 1 tablet by mouth 2 (two) times daily with a  meal.   carvedilol 3.125 MG tablet Commonly known as: COREG Take 1 tablet (3.125 mg total) by mouth 2 (two) times daily with a meal. What changed:  medication strength how much to take   cefdinir 300 MG capsule Commonly known as: OMNICEF Take 1 capsule (300 mg total) by mouth 2 (two) times daily. Start taking on: December 17, 2022   cholestyramine 4 g packet Commonly known as: QUESTRAN Take 1 packet (4 g  total) by mouth 2 (two) times daily.   clopidogrel 75 MG tablet Commonly known as: PLAVIX TAKE 1 TABLET BY MOUTH ONCE DAILY.   escitalopram 10 MG tablet Commonly known as: LEXAPRO Take 1 tablet by mouth daily.   furosemide 20 MG tablet Commonly known as: Lasix Take 1 tablet (20 mg total) by mouth daily. Start taking on: December 18, 2022   hydrALAZINE 25 MG tablet Commonly known as: APRESOLINE Take 25 mg by mouth 3 (three) times daily.   ICaps Caps Take 1 capsule by mouth 2 (two) times daily.   nitroGLYCERIN 0.4 MG SL tablet Commonly known as: NITROSTAT Place 0.4 mg under the tongue every 5 (five) minutes as needed for chest pain.   pantoprazole 20 MG tablet Commonly known as: PROTONIX Take 20 mg by mouth 2 (two) times daily.   potassium chloride 10 MEQ tablet Commonly known as: KLOR-CON M Take 1 tablet (10 mEq total) by mouth daily.        Follow-up Information     [name], MD. Go in 5 day(s).   Specialty: Family Medicine Why: Appointment on Tuesday, 12/20/2022 at 11:30am. Contact information: [address] [place] [address] [phone]                Discharge Exam: Filed Weights   12/12/22 1528  Weight: 44.5 kg   Physical Exam HENT:     Head: Normocephalic.     Mouth/Throat:     Pharynx: No oropharyngeal exudate.  Eyes:     General: Lids are normal.     Conjunctiva/sclera: Conjunctivae normal.  Cardiovascular:     Rate and Rhythm: Normal rate and regular rhythm.     Heart sounds: Normal heart sounds, S1 normal and S2 normal.  Pulmonary:     Breath sounds: Examination of the right-lower field reveals decreased breath sounds. Examination of the left-lower field reveals decreased breath sounds. Decreased breath sounds present. No wheezing, rhonchi or rales.  Abdominal:     Palpations: Abdomen is soft.     Tenderness: There is no abdominal tenderness.  Musculoskeletal:     Right lower leg: No swelling.     Left lower  leg: No swelling.  Skin:    General: Skin is warm.     Findings: No rash.  Neurological:     Mental Status: She is alert and oriented to person, place, and time.      Condition at discharge: stable  The results of significant diagnostics from this hospitalization (including imaging, microbiology, ancillary and laboratory) are listed below for reference.   Imaging Studies: ECHOCARDIOGRAM COMPLETE  Result Date: 12/14/2022    ECHOCARDIOGRAM REPORT   Patient Name:   Sharon Barajas Date of Exam: 12/14/2022 Medical Rec #:  [id number]         Height:       58.0 in Accession #:    [id number]        Weight:       98.0 lb Date of Birth:  [birth year]  meal.   carvedilol 3.125 MG tablet Commonly known as: COREG Take 1 tablet (3.125 mg total) by mouth 2 (two) times daily with a meal. What changed:  medication strength how much to take   cefdinir 300 MG capsule Commonly known as: OMNICEF Take 1 capsule (300 mg total) by mouth 2 (two) times daily. Start taking on: December 17, 2022   cholestyramine 4 g packet Commonly known as: QUESTRAN Take 1 packet (4 g  total) by mouth 2 (two) times daily.   clopidogrel 75 MG tablet Commonly known as: PLAVIX TAKE 1 TABLET BY MOUTH ONCE DAILY.   escitalopram 10 MG tablet Commonly known as: LEXAPRO Take 1 tablet by mouth daily.   furosemide 20 MG tablet Commonly known as: Lasix Take 1 tablet (20 mg total) by mouth daily. Start taking on: December 18, 2022   hydrALAZINE 25 MG tablet Commonly known as: APRESOLINE Take 25 mg by mouth 3 (three) times daily.   ICaps Caps Take 1 capsule by mouth 2 (two) times daily.   nitroGLYCERIN 0.4 MG SL tablet Commonly known as: NITROSTAT Place 0.4 mg under the tongue every 5 (five) minutes as needed for chest pain.   pantoprazole 20 MG tablet Commonly known as: PROTONIX Take 20 mg by mouth 2 (two) times daily.   potassium chloride 10 MEQ tablet Commonly known as: KLOR-CON M Take 1 tablet (10 mEq total) by mouth daily.        Follow-up Information     [name], MD. Go in 5 day(s).   Specialty: Family Medicine Why: Appointment on Tuesday, 12/20/2022 at 11:30am. Contact information: [address] [place] [address] [phone]                Discharge Exam: Filed Weights   12/12/22 1528  Weight: 44.5 kg   Physical Exam HENT:     Head: Normocephalic.     Mouth/Throat:     Pharynx: No oropharyngeal exudate.  Eyes:     General: Lids are normal.     Conjunctiva/sclera: Conjunctivae normal.  Cardiovascular:     Rate and Rhythm: Normal rate and regular rhythm.     Heart sounds: Normal heart sounds, S1 normal and S2 normal.  Pulmonary:     Breath sounds: Examination of the right-lower field reveals decreased breath sounds. Examination of the left-lower field reveals decreased breath sounds. Decreased breath sounds present. No wheezing, rhonchi or rales.  Abdominal:     Palpations: Abdomen is soft.     Tenderness: There is no abdominal tenderness.  Musculoskeletal:     Right lower leg: No swelling.     Left lower  leg: No swelling.  Skin:    General: Skin is warm.     Findings: No rash.  Neurological:     Mental Status: She is alert and oriented to person, place, and time.      Condition at discharge: stable  The results of significant diagnostics from this hospitalization (including imaging, microbiology, ancillary and laboratory) are listed below for reference.   Imaging Studies: ECHOCARDIOGRAM COMPLETE  Result Date: 12/14/2022    ECHOCARDIOGRAM REPORT   Patient Name:   Sharon Barajas Date of Exam: 12/14/2022 Medical Rec #:  [id number]         Height:       58.0 in Accession #:    [id number]        Weight:       98.0 lb Date of Birth:  [birth year]

## 2022-12-16 NOTE — Consult Note (Addendum)
[facility] Customer service manager [facility]) Accountable Care Organization (ACO) [facility] Liaison Note  12/16/2022  [name] Sep 25, 1931 [id number]  Location: [facility] Liaison screened the patient remotely at [facility].  Insurance: [facility]   [place] Sharon Barajas is a 89 y.o. female who is a Primary Care Patient of [name], MD-[facility]. The patient was screened for  readmission hospitalization with noted low risk score for unplanned readmission risk with 1 IP in 6 months.  The patient was assessed for potential [facility]) Care Management service needs for post hospital transition for care coordination. Review of patient's electronic medical record reveals patient was admitted with Acute Kidney Injury. Pt will discharged home today with HHPT/OT as indicated via TOC interventions. No anticipated needs presented for care management services at this time.   [facility] Care Management/Population Health does not replace or interfere with any arrangements made by the Inpatient Transition of Care team.   For questions contact:   Elliot Cousin, RN, [facility] Liaison [place]   Population Health Office Hours [email or web address]  8:00 am-6:00 pm [phone] mobile [phone] [Office toll free line] Office Hours are M-F 8:30 - 5 pm [email or web address]

## 2022-12-16 NOTE — Care Management Important Message (Signed)
Important Message  Patient Details  Name: Sharon Barajas MRN: [id number] Date of Birth: 06/24/1931   Medicare Important Message Given:  Yes     Johnell Comings 12/16/2022, 11:25 AM

## 2022-12-16 NOTE — Plan of Care (Signed)

## 2022-12-16 NOTE — TOC Progression Note (Signed)
Transition of Care [facility]) - Progression Note    Patient Details  Name: LEE-ANNE LEIGHT MRN: [id number] Date of Birth: 1931-04-16  [facility]) CM/SW Contact  Truddie Hidden, RN Phone Number: 12/16/2022, 9:11 AM  Clinical Narrative:    Spoke with Coralee North from [place]. Patient not accepted due to being out of the service area.      Barriers to Discharge: No Barriers Identified Expected Discharge Plan and Services         Expected Discharge Date: 12/16/22                           [facility] Agency: [facility] Date [facility] Agency Contacted: 12/16/22       Social Determinants of Health (SDOH) Interventions SDOH Screenings   Food Insecurity: No Food Insecurity (12/13/2022)  Housing: Low Risk  (12/13/2022)  Transportation Needs: No Transportation Needs (12/13/2022)  Utilities: Not At Risk (12/13/2022)  Financial Resource Strain: Low Risk  (11/16/2022)   Received from [facility]  Tobacco Use: Low Risk  (12/13/2022)    Readmission Risk Interventions     No data to display

## 2022-12-16 NOTE — TOC Transition Note (Addendum)
Transition of Care [facility]) - CM/SW Discharge Note   Patient Details  Name: Sharon Barajas MRN: [id number] Date of Birth: [birth year]  Transition of Care [facility]) CM/SW Contact:  Darolyn Rua, LCSW Phone Number: 12/16/2022, 8:27 AM   Clinical Narrative:     Patient to discharge home today with home health PT and OT through Delila Spence with [facility] informed of discharge. Patient's granddaughter to transport home, no further discharge needs at this time.   (Previous referral sent to [facility] 9/12 Coralee North reports they declined due to staffing)  Final next level of care: Home w Home Health Services Barriers to Discharge: No Barriers Identified   Patient Goals and CMS Choice CMS Medicare.gov Compare Post Acute Care list provided to:: Patient Choice offered to / list presented to : Patient  Discharge Placement                         Discharge Plan and Services Additional resources added to the After Visit Summary for                              [facility] Agency: [facility] Date [facility] Agency Contacted: 12/16/22      Social Determinants of Health (SDOH) Interventions SDOH Screenings   Food Insecurity: No Food Insecurity (12/13/2022)  Housing: Low Risk  (12/13/2022)  Transportation Needs: No Transportation Needs (12/13/2022)  Utilities: Not At Risk (12/13/2022)  Financial Resource Strain: Low Risk  (11/16/2022)   Received from [facility]  Tobacco Use: Low Risk  (12/13/2022)     Readmission Risk Interventions     No data to display

## 2022-12-19 DIAGNOSIS — J9601 Acute respiratory failure with hypoxia: Secondary | ICD-10-CM | POA: Diagnosis not present

## 2022-12-19 DIAGNOSIS — N179 Acute kidney failure, unspecified: Secondary | ICD-10-CM | POA: Diagnosis not present

## 2022-12-19 DIAGNOSIS — I739 Peripheral vascular disease, unspecified: Secondary | ICD-10-CM | POA: Diagnosis not present

## 2022-12-19 DIAGNOSIS — I130 Hypertensive heart and chronic kidney disease with heart failure and stage 1 through stage 4 chronic kidney disease, or unspecified chronic kidney disease: Secondary | ICD-10-CM | POA: Diagnosis not present

## 2022-12-19 DIAGNOSIS — I700 Atherosclerosis of aorta: Secondary | ICD-10-CM | POA: Diagnosis not present

## 2022-12-19 DIAGNOSIS — N1831 Chronic kidney disease, stage 3a: Secondary | ICD-10-CM | POA: Diagnosis not present

## 2022-12-19 DIAGNOSIS — I5033 Acute on chronic diastolic (congestive) heart failure: Secondary | ICD-10-CM | POA: Diagnosis not present

## 2022-12-19 DIAGNOSIS — I6523 Occlusion and stenosis of bilateral carotid arteries: Secondary | ICD-10-CM | POA: Diagnosis not present

## 2022-12-19 DIAGNOSIS — I088 Other rheumatic multiple valve diseases: Secondary | ICD-10-CM | POA: Diagnosis not present

## 2022-12-20 DIAGNOSIS — R197 Diarrhea, unspecified: Secondary | ICD-10-CM | POA: Diagnosis not present

## 2022-12-20 DIAGNOSIS — Z09 Encounter for follow-up examination after completed treatment for conditions other than malignant neoplasm: Secondary | ICD-10-CM | POA: Diagnosis not present

## 2022-12-20 DIAGNOSIS — N179 Acute kidney failure, unspecified: Secondary | ICD-10-CM | POA: Diagnosis not present

## 2022-12-20 DIAGNOSIS — I10 Essential (primary) hypertension: Secondary | ICD-10-CM | POA: Diagnosis not present

## 2022-12-27 ENCOUNTER — Ambulatory Visit: Payer: Medicare HMO | Attending: Cardiology | Admitting: Cardiology

## 2022-12-27 VITALS — BP 122/48 | HR 82 | Wt 93.0 lb

## 2022-12-27 DIAGNOSIS — I5032 Chronic diastolic (congestive) heart failure: Secondary | ICD-10-CM | POA: Diagnosis not present

## 2022-12-27 DIAGNOSIS — N1831 Chronic kidney disease, stage 3a: Secondary | ICD-10-CM | POA: Diagnosis not present

## 2022-12-27 DIAGNOSIS — I739 Peripheral vascular disease, unspecified: Secondary | ICD-10-CM | POA: Diagnosis not present

## 2022-12-27 DIAGNOSIS — N179 Acute kidney failure, unspecified: Secondary | ICD-10-CM | POA: Diagnosis not present

## 2022-12-27 DIAGNOSIS — J9601 Acute respiratory failure with hypoxia: Secondary | ICD-10-CM | POA: Diagnosis not present

## 2022-12-27 DIAGNOSIS — I130 Hypertensive heart and chronic kidney disease with heart failure and stage 1 through stage 4 chronic kidney disease, or unspecified chronic kidney disease: Secondary | ICD-10-CM | POA: Diagnosis not present

## 2022-12-27 DIAGNOSIS — I088 Other rheumatic multiple valve diseases: Secondary | ICD-10-CM | POA: Diagnosis not present

## 2022-12-27 DIAGNOSIS — I5033 Acute on chronic diastolic (congestive) heart failure: Secondary | ICD-10-CM | POA: Diagnosis not present

## 2022-12-27 NOTE — Progress Notes (Signed)
ADVANCED HEART FAILURE CLINIC NOTE  Referring Physician: [name], MD  Primary Care: [name], MD   HPI: Sharon Barajas is a 89 y.o. female with peripheral vascular disease, hypertension, hyperlipidemia and recently diagnosed HFpEF presenting today as posthospital follow-up.  She was admitted in early September 2024.  At that time she was having diarrhea, was recently treated for a UTI and presented to the hospital with AKI and mild volume overload.  She was diuresed and discharged home without difficulty.  On my evaluation, sitting comfortably in her chair in no apparent distress.  Unfortunately she is unable to provide me with any history or symptoms.  Her family reports that her biggest complaints are low back pain but otherwise are unable to provide much insight.   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   Hypercholesteremia    Hypertension    Macular degeneration     Current Outpatient Medications  Medication Sig Dispense Refill   acetaminophen (TYLENOL) 325 MG tablet Take 650 mg by mouth every 8 (eight) hours as needed for mild pain.     albuterol (VENTOLIN HFA) 108 (90 Base) MCG/ACT inhaler Inhale 2 puffs into the lungs every 6 (six) hours as needed for wheezing.     amLODipine (NORVASC) 10 MG tablet Take 10 mg by mouth every morning.     atorvastatin (LIPITOR) 10 MG tablet Take 10 mg by mouth daily.     Calcium Carbonate-Vitamin D 600-400 MG-UNIT tablet Take 1 tablet by mouth 2 (two) times daily with a meal.     carvedilol (COREG) 3.125 MG tablet Take 1 tablet (3.125 mg total) by mouth 2 (two) times daily with a meal. 60 tablet 0   cefdinir (OMNICEF) 300 MG capsule Take 1 capsule (300 mg total) by mouth 2 (two) times daily.     cholestyramine (QUESTRAN) 4 g packet Take 1 packet (4 g total) by mouth 2 (two) times daily. 60 each 0    clopidogrel (PLAVIX) 75 MG tablet TAKE 1 TABLET BY MOUTH ONCE DAILY. 30 tablet 1   escitalopram (LEXAPRO) 10 MG tablet Take 1 tablet by mouth daily.     furosemide (LASIX) 20 MG tablet Take 1 tablet (20 mg total) by mouth daily. 30 tablet 0   hydrALAZINE (APRESOLINE) 25 MG tablet Take 25 mg by mouth 3 (three) times daily.     Multiple Vitamins-Minerals (ICAPS) CAPS Take 1 capsule by mouth 2 (two) times daily.     nitroGLYCERIN (NITROSTAT) 0.4 MG SL tablet Place 0.4 mg under the tongue every 5 (five) minutes as needed for chest pain.     pantoprazole (PROTONIX) 20 MG tablet Take 20 mg by mouth 2 (two) times daily.     potassium chloride (KLOR-CON M) 10 MEQ tablet Take 1 tablet (10 mEq total) by mouth daily. 30 tablet 0   No current facility-administered medications for this visit.    No Known Allergies    Social History   Socioeconomic History   Marital status: Widowed    Spouse name: Not on file   Number of children: Not on file   Years of education: Not on file   Highest education level: Not on file  Occupational History   Not on file  Tobacco Use   Smoking status: Never   Smokeless tobacco: Never  Substance and Sexual Activity  Alcohol use: No   Drug use: No   Sexual activity: Yes    Birth control/protection: Surgical  Other Topics Concern   Not on file  Social History Narrative   Not on file   Social Determinants of Health   Financial Resource Strain: Low Risk  (11/16/2022)   Received from [facility]   Overall Financial Resource Strain (CARDIA)    Difficulty of Paying Living Expenses: Not hard at all  Food Insecurity: No Food Insecurity (12/13/2022)   Hunger Vital Sign    Worried About Running Out of Food in the Last Year: Never true    Ran Out of Food in the Last Year: Never true  Transportation Needs: No Transportation Needs (12/13/2022)   PRAPARE - Administrator, Civil Service (Medical): No    Lack of Transportation (Non-Medical):  No  Physical Activity: Not on file  Stress: Not on file  Social Connections: Not on file  Intimate Partner Violence: Not At Risk (12/13/2022)   Humiliation, Afraid, Rape, and Kick questionnaire    Fear of Current or Ex-Partner: No    Emotionally Abused: No    Physically Abused: No    Sexually Abused: No     No family history on file.  PHYSICAL EXAM: Vitals:   12/27/22 1304  BP: (!) 122/48  Pulse: 82  SpO2: 98%   GENERAL: Elderly white female in no apparent distress HEENT: Negative for arcus senilis or xanthelasma. There is no scleral icterus.  The mucous membranes are pink and moist.   NECK: Supple, No masses. Normal carotid upstrokes without bruits. No masses or thyromegaly.    CHEST: There are no chest wall deformities. There is no chest wall tenderness. Respirations are unlabored.  Lungs-CTA bilaterally CARDIAC:  JVP: 5 cm H2O         Normal S1, S2  Normal rate with regular rhythm. No murmurs, rubs or gallops.  Pulses are 2+ and symmetrical in upper and lower extremities.  No edema.  ABDOMEN: Soft, non-tender, non-distended. There are no masses or hepatomegaly. There are normal bowel sounds.  EXTREMITIES: Warm and well perfused with no cyanosis, clubbing.  LYMPHATIC: No axillary or supraclavicular lymphadenopathy.  NEUROLOGIC: Patient is oriented x3 with no focal or lateralizing neurologic deficits.  PSYCH: Patients affect is appropriate, there is no evidence of anxiety or depression.  SKIN: Warm and dry; no lesions or wounds.   DATA REVIEW  ECG: 12/12/22: sinus tachycardia w/ PAC   ECHO: 12/14/22: LVEF 60-65%, grade I DD As per my personal interpretation   ASSESSMENT & PLAN:  Heart failure with preserved ejection fraction -Currently taking Lasix 20 mg daily.  Euvolemic on exam. -Would hold off on SGLT2 inhibitor at this time due to recent UTI, age and comorbidities. -Discontinue carvedilol. -Repeat BMP/BNP today.   Emilynn Srinivasan Advanced Heart  Failure Mechanical Circulatory Support

## 2022-12-27 NOTE — Addendum Note (Signed)
Addended by: Electa Sniff on: 12/27/2022 02:17 PM   Modules accepted: Orders

## 2022-12-28 DIAGNOSIS — I739 Peripheral vascular disease, unspecified: Secondary | ICD-10-CM | POA: Diagnosis not present

## 2022-12-28 DIAGNOSIS — I6523 Occlusion and stenosis of bilateral carotid arteries: Secondary | ICD-10-CM | POA: Diagnosis not present

## 2022-12-28 DIAGNOSIS — I700 Atherosclerosis of aorta: Secondary | ICD-10-CM | POA: Diagnosis not present

## 2022-12-28 DIAGNOSIS — I088 Other rheumatic multiple valve diseases: Secondary | ICD-10-CM | POA: Diagnosis not present

## 2022-12-28 DIAGNOSIS — J9601 Acute respiratory failure with hypoxia: Secondary | ICD-10-CM | POA: Diagnosis not present

## 2022-12-28 DIAGNOSIS — I5033 Acute on chronic diastolic (congestive) heart failure: Secondary | ICD-10-CM | POA: Diagnosis not present

## 2022-12-28 DIAGNOSIS — N179 Acute kidney failure, unspecified: Secondary | ICD-10-CM | POA: Diagnosis not present

## 2022-12-28 DIAGNOSIS — I130 Hypertensive heart and chronic kidney disease with heart failure and stage 1 through stage 4 chronic kidney disease, or unspecified chronic kidney disease: Secondary | ICD-10-CM | POA: Diagnosis not present

## 2022-12-28 DIAGNOSIS — N1831 Chronic kidney disease, stage 3a: Secondary | ICD-10-CM | POA: Diagnosis not present

## 2022-12-28 LAB — BASIC METABOLIC PANEL
BUN/Creatinine Ratio: 13 (ref 12–28)
BUN: 19 mg/dL (ref 8–27)
CO2: 12 mmol/L — ABNORMAL LOW (ref 20–29)
Calcium: 9.5 mg/dL (ref 8.7–10.3)
Chloride: 114 mmol/L — ABNORMAL HIGH (ref 96–106)
Creatinine, Ser: 1.52 mg/dL — ABNORMAL HIGH (ref 0.57–1.00)
Glucose: 97 mg/dL (ref 70–99)
Potassium: 5.4 mmol/L — ABNORMAL HIGH (ref 3.5–5.2)
Sodium: 141 mmol/L (ref 134–144)
eGFR: 33 mL/min/{1.73_m2} — ABNORMAL LOW (ref >59)

## 2022-12-28 LAB — BRAIN NATRIURETIC PEPTIDE: BNP: 310.7 pg/mL — ABNORMAL HIGH (ref 0.0–100.0)

## 2022-12-30 DIAGNOSIS — I6523 Occlusion and stenosis of bilateral carotid arteries: Secondary | ICD-10-CM | POA: Diagnosis not present

## 2022-12-30 DIAGNOSIS — I739 Peripheral vascular disease, unspecified: Secondary | ICD-10-CM | POA: Diagnosis not present

## 2022-12-30 DIAGNOSIS — N179 Acute kidney failure, unspecified: Secondary | ICD-10-CM | POA: Diagnosis not present

## 2022-12-30 DIAGNOSIS — I130 Hypertensive heart and chronic kidney disease with heart failure and stage 1 through stage 4 chronic kidney disease, or unspecified chronic kidney disease: Secondary | ICD-10-CM | POA: Diagnosis not present

## 2022-12-30 DIAGNOSIS — J9601 Acute respiratory failure with hypoxia: Secondary | ICD-10-CM | POA: Diagnosis not present

## 2022-12-30 DIAGNOSIS — N1831 Chronic kidney disease, stage 3a: Secondary | ICD-10-CM | POA: Diagnosis not present

## 2022-12-30 DIAGNOSIS — I5033 Acute on chronic diastolic (congestive) heart failure: Secondary | ICD-10-CM | POA: Diagnosis not present

## 2022-12-30 DIAGNOSIS — I700 Atherosclerosis of aorta: Secondary | ICD-10-CM | POA: Diagnosis not present

## 2022-12-30 DIAGNOSIS — I088 Other rheumatic multiple valve diseases: Secondary | ICD-10-CM | POA: Diagnosis not present

## 2023-01-03 DIAGNOSIS — I700 Atherosclerosis of aorta: Secondary | ICD-10-CM | POA: Diagnosis not present

## 2023-01-03 DIAGNOSIS — I739 Peripheral vascular disease, unspecified: Secondary | ICD-10-CM | POA: Diagnosis not present

## 2023-01-03 DIAGNOSIS — I5033 Acute on chronic diastolic (congestive) heart failure: Secondary | ICD-10-CM | POA: Diagnosis not present

## 2023-01-03 DIAGNOSIS — N179 Acute kidney failure, unspecified: Secondary | ICD-10-CM | POA: Diagnosis not present

## 2023-01-03 DIAGNOSIS — N1831 Chronic kidney disease, stage 3a: Secondary | ICD-10-CM | POA: Diagnosis not present

## 2023-01-03 DIAGNOSIS — J9601 Acute respiratory failure with hypoxia: Secondary | ICD-10-CM | POA: Diagnosis not present

## 2023-01-03 DIAGNOSIS — I130 Hypertensive heart and chronic kidney disease with heart failure and stage 1 through stage 4 chronic kidney disease, or unspecified chronic kidney disease: Secondary | ICD-10-CM | POA: Diagnosis not present

## 2023-01-03 DIAGNOSIS — I6523 Occlusion and stenosis of bilateral carotid arteries: Secondary | ICD-10-CM | POA: Diagnosis not present

## 2023-01-03 DIAGNOSIS — I088 Other rheumatic multiple valve diseases: Secondary | ICD-10-CM | POA: Diagnosis not present

## 2023-01-05 DIAGNOSIS — N179 Acute kidney failure, unspecified: Secondary | ICD-10-CM | POA: Diagnosis not present

## 2023-01-05 DIAGNOSIS — I6523 Occlusion and stenosis of bilateral carotid arteries: Secondary | ICD-10-CM | POA: Diagnosis not present

## 2023-01-05 DIAGNOSIS — N1831 Chronic kidney disease, stage 3a: Secondary | ICD-10-CM | POA: Diagnosis not present

## 2023-01-05 DIAGNOSIS — I130 Hypertensive heart and chronic kidney disease with heart failure and stage 1 through stage 4 chronic kidney disease, or unspecified chronic kidney disease: Secondary | ICD-10-CM | POA: Diagnosis not present

## 2023-01-05 DIAGNOSIS — I5033 Acute on chronic diastolic (congestive) heart failure: Secondary | ICD-10-CM | POA: Diagnosis not present

## 2023-01-05 DIAGNOSIS — J9601 Acute respiratory failure with hypoxia: Secondary | ICD-10-CM | POA: Diagnosis not present

## 2023-01-05 DIAGNOSIS — I088 Other rheumatic multiple valve diseases: Secondary | ICD-10-CM | POA: Diagnosis not present

## 2023-01-05 DIAGNOSIS — I739 Peripheral vascular disease, unspecified: Secondary | ICD-10-CM | POA: Diagnosis not present

## 2023-01-05 DIAGNOSIS — I700 Atherosclerosis of aorta: Secondary | ICD-10-CM | POA: Diagnosis not present

## 2023-01-10 DIAGNOSIS — N1831 Chronic kidney disease, stage 3a: Secondary | ICD-10-CM | POA: Diagnosis not present

## 2023-01-10 DIAGNOSIS — I6523 Occlusion and stenosis of bilateral carotid arteries: Secondary | ICD-10-CM | POA: Diagnosis not present

## 2023-01-10 DIAGNOSIS — I088 Other rheumatic multiple valve diseases: Secondary | ICD-10-CM | POA: Diagnosis not present

## 2023-01-10 DIAGNOSIS — I739 Peripheral vascular disease, unspecified: Secondary | ICD-10-CM | POA: Diagnosis not present

## 2023-01-10 DIAGNOSIS — I700 Atherosclerosis of aorta: Secondary | ICD-10-CM | POA: Diagnosis not present

## 2023-01-10 DIAGNOSIS — N179 Acute kidney failure, unspecified: Secondary | ICD-10-CM | POA: Diagnosis not present

## 2023-01-10 DIAGNOSIS — I130 Hypertensive heart and chronic kidney disease with heart failure and stage 1 through stage 4 chronic kidney disease, or unspecified chronic kidney disease: Secondary | ICD-10-CM | POA: Diagnosis not present

## 2023-01-10 DIAGNOSIS — I5033 Acute on chronic diastolic (congestive) heart failure: Secondary | ICD-10-CM | POA: Diagnosis not present

## 2023-01-10 DIAGNOSIS — J9601 Acute respiratory failure with hypoxia: Secondary | ICD-10-CM | POA: Diagnosis not present

## 2023-01-11 DIAGNOSIS — I088 Other rheumatic multiple valve diseases: Secondary | ICD-10-CM | POA: Diagnosis not present

## 2023-01-11 DIAGNOSIS — J9601 Acute respiratory failure with hypoxia: Secondary | ICD-10-CM | POA: Diagnosis not present

## 2023-01-11 DIAGNOSIS — I5033 Acute on chronic diastolic (congestive) heart failure: Secondary | ICD-10-CM | POA: Diagnosis not present

## 2023-01-11 DIAGNOSIS — I130 Hypertensive heart and chronic kidney disease with heart failure and stage 1 through stage 4 chronic kidney disease, or unspecified chronic kidney disease: Secondary | ICD-10-CM | POA: Diagnosis not present

## 2023-01-11 DIAGNOSIS — I739 Peripheral vascular disease, unspecified: Secondary | ICD-10-CM | POA: Diagnosis not present

## 2023-01-11 DIAGNOSIS — N179 Acute kidney failure, unspecified: Secondary | ICD-10-CM | POA: Diagnosis not present

## 2023-01-11 DIAGNOSIS — N1831 Chronic kidney disease, stage 3a: Secondary | ICD-10-CM | POA: Diagnosis not present

## 2023-01-11 DIAGNOSIS — I6523 Occlusion and stenosis of bilateral carotid arteries: Secondary | ICD-10-CM | POA: Diagnosis not present

## 2023-01-11 DIAGNOSIS — I700 Atherosclerosis of aorta: Secondary | ICD-10-CM | POA: Diagnosis not present

## 2023-01-17 ENCOUNTER — Other Ambulatory Visit (INDEPENDENT_AMBULATORY_CARE_PROVIDER_SITE_OTHER): Payer: Self-pay | Admitting: Nurse Practitioner

## 2023-01-17 DIAGNOSIS — I5033 Acute on chronic diastolic (congestive) heart failure: Secondary | ICD-10-CM | POA: Diagnosis not present

## 2023-01-17 DIAGNOSIS — I130 Hypertensive heart and chronic kidney disease with heart failure and stage 1 through stage 4 chronic kidney disease, or unspecified chronic kidney disease: Secondary | ICD-10-CM | POA: Diagnosis not present

## 2023-01-17 DIAGNOSIS — N179 Acute kidney failure, unspecified: Secondary | ICD-10-CM | POA: Diagnosis not present

## 2023-01-17 DIAGNOSIS — I700 Atherosclerosis of aorta: Secondary | ICD-10-CM | POA: Diagnosis not present

## 2023-01-17 DIAGNOSIS — I6523 Occlusion and stenosis of bilateral carotid arteries: Secondary | ICD-10-CM | POA: Diagnosis not present

## 2023-01-17 DIAGNOSIS — N1831 Chronic kidney disease, stage 3a: Secondary | ICD-10-CM | POA: Diagnosis not present

## 2023-01-17 DIAGNOSIS — I088 Other rheumatic multiple valve diseases: Secondary | ICD-10-CM | POA: Diagnosis not present

## 2023-01-17 DIAGNOSIS — I739 Peripheral vascular disease, unspecified: Secondary | ICD-10-CM | POA: Diagnosis not present

## 2023-01-17 DIAGNOSIS — J9601 Acute respiratory failure with hypoxia: Secondary | ICD-10-CM | POA: Diagnosis not present

## 2023-01-19 ENCOUNTER — Other Ambulatory Visit (INDEPENDENT_AMBULATORY_CARE_PROVIDER_SITE_OTHER): Payer: Self-pay | Admitting: Nurse Practitioner

## 2023-01-19 DIAGNOSIS — N1831 Chronic kidney disease, stage 3a: Secondary | ICD-10-CM | POA: Diagnosis not present

## 2023-01-19 DIAGNOSIS — I088 Other rheumatic multiple valve diseases: Secondary | ICD-10-CM | POA: Diagnosis not present

## 2023-01-19 DIAGNOSIS — I739 Peripheral vascular disease, unspecified: Secondary | ICD-10-CM | POA: Diagnosis not present

## 2023-01-19 DIAGNOSIS — I6523 Occlusion and stenosis of bilateral carotid arteries: Secondary | ICD-10-CM | POA: Diagnosis not present

## 2023-01-19 DIAGNOSIS — I700 Atherosclerosis of aorta: Secondary | ICD-10-CM | POA: Diagnosis not present

## 2023-01-19 DIAGNOSIS — I5033 Acute on chronic diastolic (congestive) heart failure: Secondary | ICD-10-CM | POA: Diagnosis not present

## 2023-01-19 DIAGNOSIS — N179 Acute kidney failure, unspecified: Secondary | ICD-10-CM | POA: Diagnosis not present

## 2023-01-19 DIAGNOSIS — I130 Hypertensive heart and chronic kidney disease with heart failure and stage 1 through stage 4 chronic kidney disease, or unspecified chronic kidney disease: Secondary | ICD-10-CM | POA: Diagnosis not present

## 2023-01-19 DIAGNOSIS — J9601 Acute respiratory failure with hypoxia: Secondary | ICD-10-CM | POA: Diagnosis not present

## 2023-01-24 DIAGNOSIS — N179 Acute kidney failure, unspecified: Secondary | ICD-10-CM | POA: Diagnosis not present

## 2023-01-24 DIAGNOSIS — I130 Hypertensive heart and chronic kidney disease with heart failure and stage 1 through stage 4 chronic kidney disease, or unspecified chronic kidney disease: Secondary | ICD-10-CM | POA: Diagnosis not present

## 2023-01-24 DIAGNOSIS — I088 Other rheumatic multiple valve diseases: Secondary | ICD-10-CM | POA: Diagnosis not present

## 2023-01-24 DIAGNOSIS — I739 Peripheral vascular disease, unspecified: Secondary | ICD-10-CM | POA: Diagnosis not present

## 2023-01-24 DIAGNOSIS — N1831 Chronic kidney disease, stage 3a: Secondary | ICD-10-CM | POA: Diagnosis not present

## 2023-01-24 DIAGNOSIS — I5033 Acute on chronic diastolic (congestive) heart failure: Secondary | ICD-10-CM | POA: Diagnosis not present

## 2023-01-24 DIAGNOSIS — J9601 Acute respiratory failure with hypoxia: Secondary | ICD-10-CM | POA: Diagnosis not present

## 2023-01-24 DIAGNOSIS — I6523 Occlusion and stenosis of bilateral carotid arteries: Secondary | ICD-10-CM | POA: Diagnosis not present

## 2023-01-24 DIAGNOSIS — I700 Atherosclerosis of aorta: Secondary | ICD-10-CM | POA: Diagnosis not present

## 2023-01-26 DIAGNOSIS — N1831 Chronic kidney disease, stage 3a: Secondary | ICD-10-CM | POA: Diagnosis not present

## 2023-01-26 DIAGNOSIS — I739 Peripheral vascular disease, unspecified: Secondary | ICD-10-CM | POA: Diagnosis not present

## 2023-01-26 DIAGNOSIS — I700 Atherosclerosis of aorta: Secondary | ICD-10-CM | POA: Diagnosis not present

## 2023-01-26 DIAGNOSIS — I6523 Occlusion and stenosis of bilateral carotid arteries: Secondary | ICD-10-CM | POA: Diagnosis not present

## 2023-01-26 DIAGNOSIS — I5033 Acute on chronic diastolic (congestive) heart failure: Secondary | ICD-10-CM | POA: Diagnosis not present

## 2023-01-26 DIAGNOSIS — J9601 Acute respiratory failure with hypoxia: Secondary | ICD-10-CM | POA: Diagnosis not present

## 2023-01-26 DIAGNOSIS — N179 Acute kidney failure, unspecified: Secondary | ICD-10-CM | POA: Diagnosis not present

## 2023-01-26 DIAGNOSIS — I088 Other rheumatic multiple valve diseases: Secondary | ICD-10-CM | POA: Diagnosis not present

## 2023-01-26 DIAGNOSIS — I130 Hypertensive heart and chronic kidney disease with heart failure and stage 1 through stage 4 chronic kidney disease, or unspecified chronic kidney disease: Secondary | ICD-10-CM | POA: Diagnosis not present

## 2023-01-27 NOTE — Telephone Encounter (Signed)
ATC pt to schedule appt for refill. Home/Cell phone rang but no message picked up to LM. Unable to contact.

## 2023-02-14 DIAGNOSIS — K649 Unspecified hemorrhoids: Secondary | ICD-10-CM | POA: Diagnosis not present

## 2023-02-14 DIAGNOSIS — L89151 Pressure ulcer of sacral region, stage 1: Secondary | ICD-10-CM | POA: Diagnosis not present

## 2023-02-18 ENCOUNTER — Inpatient Hospital Stay
Admission: EM | Admit: 2023-02-18 | Discharge: 2023-02-21 | DRG: 682 | Disposition: A | Discharge status: Home Health | Payer: Medicare HMO | Attending: Internal Medicine | Admitting: Internal Medicine

## 2023-02-18 ENCOUNTER — Other Ambulatory Visit: Payer: Self-pay

## 2023-02-18 DIAGNOSIS — Z23 Encounter for immunization: Secondary | ICD-10-CM | POA: Diagnosis not present

## 2023-02-18 DIAGNOSIS — Z90710 Acquired absence of both cervix and uterus: Secondary | ICD-10-CM

## 2023-02-18 DIAGNOSIS — Z7902 Long term (current) use of antithrombotics/antiplatelets: Secondary | ICD-10-CM | POA: Diagnosis not present

## 2023-02-18 DIAGNOSIS — Z79899 Other long term (current) drug therapy: Secondary | ICD-10-CM

## 2023-02-18 DIAGNOSIS — H3530 Unspecified macular degeneration: Secondary | ICD-10-CM | POA: Diagnosis present

## 2023-02-18 DIAGNOSIS — I130 Hypertensive heart and chronic kidney disease with heart failure and stage 1 through stage 4 chronic kidney disease, or unspecified chronic kidney disease: Secondary | ICD-10-CM | POA: Diagnosis present

## 2023-02-18 DIAGNOSIS — I6523 Occlusion and stenosis of bilateral carotid arteries: Secondary | ICD-10-CM | POA: Diagnosis not present

## 2023-02-18 DIAGNOSIS — E43 Unspecified severe protein-calorie malnutrition: Secondary | ICD-10-CM | POA: Diagnosis not present

## 2023-02-18 DIAGNOSIS — N179 Acute kidney failure, unspecified: Principal | ICD-10-CM

## 2023-02-18 DIAGNOSIS — Z9049 Acquired absence of other specified parts of digestive tract: Secondary | ICD-10-CM | POA: Diagnosis not present

## 2023-02-18 DIAGNOSIS — E860 Dehydration: Secondary | ICD-10-CM | POA: Diagnosis not present

## 2023-02-18 DIAGNOSIS — H9192 Unspecified hearing loss, left ear: Secondary | ICD-10-CM | POA: Diagnosis present

## 2023-02-18 DIAGNOSIS — Z681 Body mass index (BMI) 19 or less, adult: Secondary | ICD-10-CM

## 2023-02-18 DIAGNOSIS — I5032 Chronic diastolic (congestive) heart failure: Secondary | ICD-10-CM | POA: Diagnosis not present

## 2023-02-18 DIAGNOSIS — E7800 Pure hypercholesterolemia, unspecified: Secondary | ICD-10-CM | POA: Diagnosis present

## 2023-02-18 DIAGNOSIS — E861 Hypovolemia: Secondary | ICD-10-CM | POA: Diagnosis present

## 2023-02-18 DIAGNOSIS — K649 Unspecified hemorrhoids: Secondary | ICD-10-CM | POA: Diagnosis present

## 2023-02-18 DIAGNOSIS — R197 Diarrhea, unspecified: Secondary | ICD-10-CM | POA: Diagnosis present

## 2023-02-18 DIAGNOSIS — N184 Chronic kidney disease, stage 4 (severe): Secondary | ICD-10-CM | POA: Diagnosis not present

## 2023-02-18 DIAGNOSIS — K529 Noninfective gastroenteritis and colitis, unspecified: Secondary | ICD-10-CM | POA: Diagnosis present

## 2023-02-18 HISTORY — DX: Acute kidney failure, unspecified: N17.9

## 2023-02-18 LAB — COMPREHENSIVE METABOLIC PANEL
ALT: 14 U/L (ref 0–44)
AST: 17 U/L (ref 15–41)
Albumin: 3.5 g/dL (ref 3.5–5.0)
Alkaline Phosphatase: 52 U/L (ref 38–126)
Anion gap: 12 (ref 5–15)
BUN: 45 mg/dL — ABNORMAL HIGH (ref 8–23)
CO2: 22 mmol/L (ref 22–32)
Calcium: 9.2 mg/dL (ref 8.9–10.3)
Chloride: 107 mmol/L (ref 98–111)
Creatinine, Ser: 2.29 mg/dL — ABNORMAL HIGH (ref 0.44–1.00)
GFR, Estimated: 20 mL/min — ABNORMAL LOW (ref >60)
Glucose, Bld: 120 mg/dL — ABNORMAL HIGH (ref 70–99)
Potassium: 3.8 mmol/L (ref 3.5–5.1)
Sodium: 141 mmol/L (ref 135–145)
Total Bilirubin: 0.5 mg/dL (ref <1.2)
Total Protein: 6.1 g/dL — ABNORMAL LOW (ref 6.5–8.1)

## 2023-02-18 LAB — LIPASE, BLOOD: Lipase: 56 U/L — ABNORMAL HIGH (ref 11–51)

## 2023-02-18 LAB — CBC
HCT: 32.6 % — ABNORMAL LOW (ref 36.0–46.0)
Hemoglobin: 10.2 g/dL — ABNORMAL LOW (ref 12.0–15.0)
MCH: 30.6 pg (ref 26.0–34.0)
MCHC: 31.3 g/dL (ref 30.0–36.0)
MCV: 97.9 fL (ref 80.0–100.0)
Platelets: 194 10*3/uL (ref 150–400)
RBC: 3.33 MIL/uL — ABNORMAL LOW (ref 3.87–5.11)
RDW: 13.9 % (ref 11.5–15.5)
WBC: 11.1 10*3/uL — ABNORMAL HIGH (ref 4.0–10.5)
nRBC: 0 % (ref 0.0–0.2)

## 2023-02-18 MED ORDER — SODIUM CHLORIDE 0.9 % IV SOLN: INTRAVENOUS | Status: AC

## 2023-02-18 MED ORDER — ALBUTEROL SULFATE (2.5 MG/3ML) 0.083% IN NEBU: 2.5000 mg | INHALATION_SOLUTION | Freq: Four times a day (QID) | RESPIRATORY_TRACT | Status: DC | PRN

## 2023-02-18 MED ORDER — SODIUM CHLORIDE 0.9 % IV BOLUS
500.0000 mL | Freq: Once | INTRAVENOUS | Status: AC
  Administered 2023-02-18: 500 mL via INTRAVENOUS

## 2023-02-18 MED ORDER — ONDANSETRON HCL 4 MG/2ML IJ SOLN: 4.0000 mg | Freq: Four times a day (QID) | INTRAMUSCULAR | Status: DC | PRN

## 2023-02-18 MED ORDER — PANTOPRAZOLE SODIUM 20 MG PO TBEC
20.0000 mg | DELAYED_RELEASE_TABLET | Freq: Two times a day (BID) | ORAL | Status: DC
Start: 2023-02-19 — End: 2023-02-21
  Administered 2023-02-19 – 2023-02-21 (×5): 20 mg via ORAL
  Filled 2023-02-18 (×5): qty 1

## 2023-02-18 MED ORDER — HYDRALAZINE HCL 20 MG/ML IJ SOLN: 5.0000 mg | Freq: Four times a day (QID) | INTRAMUSCULAR | Status: DC | PRN

## 2023-02-18 MED ORDER — ATORVASTATIN CALCIUM 10 MG PO TABS
10.0000 mg | ORAL_TABLET | Freq: Every day | ORAL | Status: DC
  Administered 2023-02-19 – 2023-02-21 (×3): 10 mg via ORAL
  Filled 2023-02-18 (×3): qty 1

## 2023-02-18 MED ORDER — ACETAMINOPHEN 325 MG PO TABS
650.0000 mg | ORAL_TABLET | Freq: Three times a day (TID) | ORAL | Status: DC | PRN
  Administered 2023-02-20: 650 mg via ORAL
  Filled 2023-02-18 (×2): qty 2

## 2023-02-18 MED ORDER — ENSURE ENLIVE PO LIQD
237.0000 mL | Freq: Two times a day (BID) | ORAL | Status: DC
  Administered 2023-02-19 – 2023-02-20 (×3): 237 mL via ORAL

## 2023-02-18 MED ORDER — ESCITALOPRAM OXALATE 10 MG PO TABS: 10.0000 mg | ORAL_TABLET | Freq: Every day | ORAL | Status: DC

## 2023-02-18 MED ORDER — CLOPIDOGREL BISULFATE 75 MG PO TABS
75.0000 mg | ORAL_TABLET | Freq: Every day | ORAL | Status: DC
  Administered 2023-02-19: 75 mg via ORAL
  Filled 2023-02-18: qty 1

## 2023-02-18 MED ORDER — CHOLESTYRAMINE 4 G PO PACK
4.0000 g | PACK | Freq: Two times a day (BID) | ORAL | Status: DC
  Administered 2023-02-18 – 2023-02-21 (×6): 4 g via ORAL
  Filled 2023-02-18 (×6): qty 1

## 2023-02-18 MED ORDER — RISAQUAD PO CAPS
2.0000 | ORAL_CAPSULE | Freq: Every day | ORAL | Status: DC
  Administered 2023-02-18 – 2023-02-21 (×4): 2 via ORAL
  Filled 2023-02-18 (×4): qty 2

## 2023-02-18 MED ORDER — HYDROCORTISONE ACETATE 25 MG RE SUPP
25.0000 mg | Freq: Two times a day (BID) | RECTAL | Status: DC
  Administered 2023-02-18 – 2023-02-20 (×4): 25 mg via RECTAL
  Filled 2023-02-18 (×8): qty 1

## 2023-02-18 MED ORDER — HEPARIN SODIUM (PORCINE) 5000 UNIT/ML IJ SOLN
5000.0000 [IU] | Freq: Two times a day (BID) | INTRAMUSCULAR | Status: DC
  Administered 2023-02-18 – 2023-02-21 (×6): 5000 [IU] via SUBCUTANEOUS
  Filled 2023-02-18 (×6): qty 1

## 2023-02-18 MED ORDER — BARRIER CREAM NON-SPECIFIED
1.0000 | TOPICAL_CREAM | Freq: Two times a day (BID) | TOPICAL | Status: DC | PRN
Start: 2023-02-18 — End: 2023-02-18

## 2023-02-18 MED ORDER — ONDANSETRON HCL 4 MG PO TABS: 4.0000 mg | ORAL_TABLET | Freq: Four times a day (QID) | ORAL | Status: DC | PRN

## 2023-02-18 MED ORDER — ZINC OXIDE 40 % EX OINT: TOPICAL_OINTMENT | CUTANEOUS | Status: DC | PRN

## 2023-02-18 MED ORDER — BACID PO TABS
2.0000 | ORAL_TABLET | Freq: Three times a day (TID) | ORAL | Status: DC
  Filled 2023-02-18: qty 2

## 2023-02-18 MED ORDER — ALBUTEROL SULFATE HFA 108 (90 BASE) MCG/ACT IN AERS: 2.0000 | INHALATION_SPRAY | Freq: Four times a day (QID) | RESPIRATORY_TRACT | Status: DC | PRN

## 2023-02-18 NOTE — ED Notes (Signed)
IV team at bedside 

## 2023-02-18 NOTE — H&P (Signed)
History and Physical    Sharon Barajas:[id number] DOB: Sep 01, 1931 DOA: 02/18/2023  PCP: [name], MD (Confirm with patient/family/NH records and if not entered, this has to be entered at [facility] point of entry) Patient coming from: Home  I have personally briefly reviewed patient's old medical records in [facility]  Chief Complaint: Diarrhea, feeling weak  HPI: Sharon Barajas is a 89 y.o. female with medical history significant of CKD stage IV, HTN, chronic HFpEF, HLD, carotid stenosis on Plavix, brought in by family member for evaluation of sudden onset of watery diarrhea.  Patient extremely hard of hearing at baseline with 2 granddaughter at bedside gave history.  Family reported the patient has a chronic loose bowel movement on and off, which they reported to somewhat related to cholecystectomy for which patient has been taking intermittently cholestyramine with some help.  Yesterday however patient suddenly developed watery diarrhea, light greenish, watery, with strong smell, 3 episodes with intermittent leaking stool minus and patient started complaining about the pain in her bottom since last night.  Denied any abdominal pain no fever no nauseous vomiting.  Another concern family has asked that the patient appears to be losing weight since a recent hospitalization in September but they could not estimate exactly how much patient has lost. ED Course: Afebrile, blood pressure 103/48, no tachycardia.  Blood work showed WBC 11.1, creatinine 2.2 compared to baseline 1.4-1.5, K3.8, BUN 48.  Review of Systems: Unable to perform, patient has extremely heart hearing  Past Medical History:  Diagnosis Date   Carotid artery stenosis    bilateral   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   Hypercholesteremia    Hypertension    Macular degeneration     Past Surgical History:  Procedure Laterality Date   ABDOMINAL HYSTERECTOMY      CHOLECYSTECTOMY     COLON SURGERY     COLONOSCOPY WITH PROPOFOL N/A 02/23/2016   Procedure: COLONOSCOPY WITH PROPOFOL;  Surgeon: Christena Deem, MD;  Location: [facility] ENDOSCOPY;  Service: Endoscopy;  Laterality: N/A;   ENDARTERECTOMY Left 12/17/2014   Procedure: ENDARTERECTOMY CAROTID;  Surgeon: Annice Needy, MD;  Location: [facility] ORS;  Service: Vascular;  Laterality: Left;   INNER EAR SURGERY Left    Removal of ear tumor.     reports that she has never smoked. She has never used smokeless tobacco. She reports that she does not drink alcohol and does not use drugs.  No Known Allergies  History reviewed. No pertinent family history.  Prior to Admission medications   Medication Sig Start Date End Date Taking? Authorizing Provider  acetaminophen (TYLENOL) 325 MG tablet Take 650 mg by mouth every 8 (eight) hours as needed for mild pain.    [provider]  albuterol (VENTOLIN HFA) 108 (90 Base) MCG/ACT inhaler Inhale 2 puffs into the lungs every 6 (six) hours as needed for wheezing. 05/26/21   [provider]  amLODipine (NORVASC) 10 MG tablet Take 10 mg by mouth every morning.    [provider]  atorvastatin (LIPITOR) 10 MG tablet Take 10 mg by mouth daily. 05/08/17   [provider]  Calcium Carbonate-Vitamin D 600-400 MG-UNIT tablet Take 1 tablet by mouth 2 (two) times daily with a meal.    [provider]  carvedilol (COREG) 3.125 MG tablet Take 1 tablet (3.125 mg total) by mouth 2 (two) times daily with a meal. 12/16/22   Wieting,  Richard, MD  cefdinir (OMNICEF) 300 MG capsule Take 1 capsule (300 mg total) by mouth 2 (two) times daily. 12/17/22   Alford Highland, MD  cholestyramine (QUESTRAN) 4 g packet Take 1 packet (4 g total) by mouth 2 (two) times daily. 12/16/22   Alford Highland, MD  clopidogrel (PLAVIX) 75 MG tablet TAKE 1 TABLET BY MOUTH ONCE DAILY. 10/31/22   Georgiana Spinner, NP  escitalopram (LEXAPRO) 10 MG tablet Take 1 tablet by mouth  daily. 11/16/22   [provider]  furosemide (LASIX) 20 MG tablet Take 1 tablet (20 mg total) by mouth daily. 12/18/22 01/17/23  Alford Highland, MD  hydrALAZINE (APRESOLINE) 25 MG tablet Take 25 mg by mouth 3 (three) times daily.    [provider]  Multiple Vitamins-Minerals (ICAPS) CAPS Take 1 capsule by mouth 2 (two) times daily.    [provider]  nitroGLYCERIN (NITROSTAT) 0.4 MG SL tablet Place 0.4 mg under the tongue every 5 (five) minutes as needed for chest pain.    [provider]  pantoprazole (PROTONIX) 20 MG tablet Take 20 mg by mouth 2 (two) times daily.    [provider]  potassium chloride (KLOR-CON M) 10 MEQ tablet Take 1 tablet (10 mEq total) by mouth daily. 12/16/22   Alford Highland, MD    Physical Exam: Vitals:   02/18/23 1106 02/18/23 1107 02/18/23 1607  BP: (!) 103/48  (!) 102/44  Pulse: 95  99  Resp: 17  18  Temp: 97.6 F (36.4 C)    TempSrc: Oral    SpO2: 95%  98%  Height:  4\' 10"  (1.473 m)     Constitutional: NAD, calm, comfortable Vitals:   02/18/23 1106 02/18/23 1107 02/18/23 1607  BP: (!) 103/48  (!) 102/44  Pulse: 95  99  Resp: 17  18  Temp: 97.6 F (36.4 C)    TempSrc: Oral    SpO2: 95%  98%  Height:  4\' 10"  (1.473 m)    Eyes: PERRL, lids and conjunctivae normal ENMT: Mucous membranes are dry. Posterior pharynx clear of any exudate or lesions.Normal dentition.  Neck: normal, supple, no masses, no thyromegaly Respiratory: clear to auscultation bilaterally, no wheezing, no crackles. Normal respiratory effort. No accessory muscle use.  Cardiovascular: Regular rate and rhythm, no murmurs / rubs / gallops. No extremity edema. 2+ pedal pulses. No carotid bruits.  Abdomen: no tenderness, no masses palpated. No hepatosplenomegaly. Bowel sounds positive.  Musculoskeletal: no clubbing / cyanosis. No joint deformity upper and lower extremities. Good ROM, no contractures. Normal muscle tone.  Skin: no rashes,  lesions, ulcers. No induration Neurologic: CN 2-12 grossly intact. Sensation intact, DTR normal. Strength 5/5 in all 4.  Psychiatric: Normal judgment and insight. Alert and oriented x 3. Normal mood.     Labs on Admission: I have personally reviewed following labs and imaging studies  CBC: Recent Labs  Lab 02/18/23 1105  WBC 11.1*  HGB 10.2*  HCT 32.6*  MCV 97.9  PLT 194   Basic Metabolic Panel: Recent Labs  Lab 02/18/23 1105  NA 141  K 3.8  CL 107  CO2 22  GLUCOSE 120*  BUN 45*  CREATININE 2.29*  CALCIUM 9.2   GFR: CrCl cannot be calculated (Unknown ideal weight.). Liver Function Tests: Recent Labs  Lab 02/18/23 1105  AST 17  ALT 14  ALKPHOS 52  BILITOT 0.5  PROT 6.1*  ALBUMIN 3.5   Recent Labs  Lab 02/18/23 1105  LIPASE 56*   No results  for input(s): "AMMONIA" in the last 168 hours. Coagulation Profile: No results for input(s): "INR", "PROTIME" in the last 168 hours. Cardiac Enzymes: No results for input(s): "CKTOTAL", "CKMB", "CKMBINDEX", "TROPONINI" in the last 168 hours. BNP (last 3 results) No results for input(s): "PROBNP" in the last 8760 hours. HbA1C: No results for input(s): "HGBA1C" in the last 72 hours. CBG: No results for input(s): "GLUCAP" in the last 168 hours. Lipid Profile: No results for input(s): "CHOL", "HDL", "LDLCALC", "TRIG", "CHOLHDL", "LDLDIRECT" in the last 72 hours. Thyroid Function Tests: No results for input(s): "TSH", "T4TOTAL", "FREET4", "T3FREE", "THYROIDAB" in the last 72 hours. Anemia Panel: No results for input(s): "VITAMINB12", "FOLATE", "FERRITIN", "TIBC", "IRON", "RETICCTPCT" in the last 72 hours. Urine analysis:    Component Value Date/Time   COLORURINE YELLOW (A) 12/14/2022 1821   APPEARANCEUR CLEAR (A) 12/14/2022 1821   LABSPEC 1.012 12/14/2022 1821   PHURINE 5.0 12/14/2022 1821   GLUCOSEU NEGATIVE 12/14/2022 1821   HGBUR NEGATIVE 12/14/2022 1821   BILIRUBINUR NEGATIVE 12/14/2022 1821   KETONESUR  NEGATIVE 12/14/2022 1821   PROTEINUR NEGATIVE 12/14/2022 1821   NITRITE NEGATIVE 12/14/2022 1821   LEUKOCYTESUR MODERATE (A) 12/14/2022 1821    Radiological Exams on Admission: No results found.  EKG: None  Assessment/Plan Principal Problem:   AKI (acute kidney injury) (HCC) Active Problems:   Acute kidney injury superimposed on CKD (HCC)   Diarrhea  (please populate well all problems here in Problem List. (For example, if patient is on BP meds at home and you resume or decide to hold them, it is a problem that needs to be her. Same for CAD, COPD, HLD and so on)  AKI on CKD stage IV -Prerenal, clinically patient still volume contracted probably secondary to acute diarrhea -IV fluid NS x 10 hours then reevaluate.  Watery diarrhea -Rule out C. difficile, sample has been sent in the ED.  HTN -BP low now, will hold off home BP meds amlodipine hydralazine and Coreg -As needed hydralazine  Chronic diarrhea Weight loss Moderate protein calorie malnutrition -Appears to be related to cholecystectomy.  Patient already on cholestyramine -Concern about malabsorption.  Outpatient workup for pancreatic insufficiency. -Calorie count and consult dietitian -Remove diet restrictions  Chronic HFpEF -Volume contracted, hydration, reevaluate volume status tomorrow -Hold off Lasix  Deconditioning -PT OT evaluation  DVT prophylaxis: Heparin subQ Code Status: Full code Family Communication: Two granddaughter at bedside Disposition Plan: Expect less than 2 midnight hospital stay Consults called: None Admission status: Tele obs   Emeline General MD [facility] Pager [phone]  02/18/2023, 4:11 PM

## 2023-02-18 NOTE — ED Triage Notes (Signed)
Per daughter- last week pt had constipation and was seen at [facility] for hemorrhoids and was taking prune juice and given hydrocortisone cream w/ improvement of s/s. Pt started having liquid stools yesterday and c/o rectal pain today.

## 2023-02-18 NOTE — ED Notes (Signed)
Pt taken to another room for rectal exam by Dr. Fanny Bien, barrier cream placed on pt's buttocks after exam due to irritation.

## 2023-02-18 NOTE — ED Provider Notes (Signed)
[facility] Provider Note    Event Date/Time   First MD Initiated Contact with Patient 02/18/23 1453     (approximate)   History   Diarrhea   HPI  Sharon Barajas is a 89 y.o. female patient has been having large amounts of liquid stool.  Fatigue.  Has a history of constipation, took medication for that and over the last day has been having very loose watery stool.  She has otherwise been well.  She complained of some pain around her rectum and was diagnosed with a hemorrhoid.  Occasionally seeing small spots of blood in her depends.  She does have some incontinence recently.  She also has a history of congestive heart failure but is not swollen.  She has been losing weight.  She has had increasing fatigue   He is extremely hard of hearing.  She denies any pain or discomfort but had pain around her rectum diagnosed with a hemorrhoid a few days ago     Physical Exam   Triage Vital Signs: ED Triage Vitals  Encounter Vitals Group     BP 02/18/23 1106 (!) 103/48     Systolic BP Percentile --      Diastolic BP Percentile --      Pulse Rate 02/18/23 1106 95     Resp 02/18/23 1106 17     Temp 02/18/23 1106 97.6 F (36.4 C)     Temp Source 02/18/23 1106 Oral     SpO2 02/18/23 1106 95 %     Weight --      Height 02/18/23 1107 4\' 10"  (1.473 m)     Head Circumference --      Peak Flow --      Pain Score 02/18/23 1107 5     Pain Loc --      Pain Education --      Exclude from Growth Chart --     Most recent vital signs: Vitals:   02/18/23 1106  BP: (!) 103/48  Pulse: 95  Resp: 17  Temp: 97.6 F (36.4 C)  SpO2: 95%     General: Awake, no distress.  Very pleasant, frail in appearance sitting up without distress accompanied by very pleasant family CV:  Good peripheral perfusion.  Mild systolic murmur Resp:  Normal effort.  Clear bilateral Abd:  No distention.  Soft nontender nondistended.  Rectal exam escorted by nursing, Ian Malkin, she has a  small external hemorrhoid.  Brown somewhat loose stool in depends, small spot of blood is noted.  No active large amount of hemorrhage. Other:  Patient denies having any pain or discomfort at this time.   ED Results / Procedures / Treatments   Labs (all labs ordered are listed, but only abnormal results are displayed) Labs Reviewed  LIPASE, BLOOD - Abnormal; Notable for the following components:      Result Value   Lipase 56 (*)    All other components within normal limits  COMPREHENSIVE METABOLIC PANEL - Abnormal; Notable for the following components:   Glucose, Bld 120 (*)    BUN 45 (*)    Creatinine, Ser 2.29 (*)    Total Protein 6.1 (*)    GFR, Estimated 20 (*)    All other components within normal limits  CBC - Abnormal; Notable for the following components:   WBC 11.1 (*)    RBC 3.33 (*)    Hemoglobin 10.2 (*)    HCT 32.6 (*)    All other  components within normal limits  STOOL CULTURE  C DIFFICILE QUICK SCREEN W PCR REFLEX       EKG     RADIOLOGY  No associated abdominal pain at this time reassuring examination.  Loose watery stool.  No right upper quadrant pain no pain to palpation in any quadrant.   PROCEDURES:  Critical Care performed: No  Procedures   MEDICATIONS ORDERED IN ED: Medications  sodium chloride 0.9 % bolus 500 mL (has no administration in time range)  liver oil-zinc oxide (DESITIN) 40 % ointment (has no administration in time range)  hydrocortisone (ANUSOL-HC) suppository 25 mg (has no administration in time range)     IMPRESSION / MDM / ASSESSMENT AND PLAN / ED COURSE  I reviewed the triage vital signs and the nursing notes.                              Differential diagnosis includes, but is not limited to, dehydration, volume loss, infectious or other causes of loose stool and diarrhea, etc.  Possible colitis unlikely to represent a vascular cause.  Currently pain and symptom free without fever except for ongoing watery stool.   Will send for stool cultures.  She is significantly dehydrated with suspected cause for her AKI.  Will hydrate.  Given the degree of AKI discussed with hospitalist, patient will be admitted to the service of Dr. Chipper Herb pending further workup and treatment with hospitalist service.  Patient and family agreeable with plan  Patient's presentation is most consistent with acute complicated illness / injury requiring diagnostic workup.          FINAL CLINICAL IMPRESSION(S) / ED DIAGNOSES   Final diagnoses:  AKI (acute kidney injury) (HCC)  Dehydration     Rx / DC Orders   ED Discharge Orders     None        Note:  This document was prepared using Dragon voice recognition software and may include unintentional dictation errors.   Sharyn Creamer, MD 02/18/23 [phone]

## 2023-02-18 NOTE — ED Notes (Signed)
This RN and Social research officer, government attempted to start IV, both unsuccessful. IV team consult placed.

## 2023-02-19 DIAGNOSIS — E861 Hypovolemia: Secondary | ICD-10-CM | POA: Diagnosis present

## 2023-02-19 DIAGNOSIS — N184 Chronic kidney disease, stage 4 (severe): Secondary | ICD-10-CM | POA: Diagnosis present

## 2023-02-19 DIAGNOSIS — E860 Dehydration: Secondary | ICD-10-CM | POA: Diagnosis present

## 2023-02-19 DIAGNOSIS — H9192 Unspecified hearing loss, left ear: Secondary | ICD-10-CM | POA: Diagnosis present

## 2023-02-19 DIAGNOSIS — I5032 Chronic diastolic (congestive) heart failure: Secondary | ICD-10-CM | POA: Diagnosis present

## 2023-02-19 DIAGNOSIS — Z90710 Acquired absence of both cervix and uterus: Secondary | ICD-10-CM | POA: Diagnosis not present

## 2023-02-19 DIAGNOSIS — I6523 Occlusion and stenosis of bilateral carotid arteries: Secondary | ICD-10-CM | POA: Diagnosis present

## 2023-02-19 DIAGNOSIS — Z681 Body mass index (BMI) 19 or less, adult: Secondary | ICD-10-CM | POA: Diagnosis not present

## 2023-02-19 DIAGNOSIS — H3530 Unspecified macular degeneration: Secondary | ICD-10-CM | POA: Diagnosis present

## 2023-02-19 DIAGNOSIS — E43 Unspecified severe protein-calorie malnutrition: Secondary | ICD-10-CM | POA: Diagnosis present

## 2023-02-19 DIAGNOSIS — I130 Hypertensive heart and chronic kidney disease with heart failure and stage 1 through stage 4 chronic kidney disease, or unspecified chronic kidney disease: Secondary | ICD-10-CM | POA: Diagnosis present

## 2023-02-19 DIAGNOSIS — Z9049 Acquired absence of other specified parts of digestive tract: Secondary | ICD-10-CM | POA: Diagnosis not present

## 2023-02-19 DIAGNOSIS — K529 Noninfective gastroenteritis and colitis, unspecified: Secondary | ICD-10-CM | POA: Diagnosis present

## 2023-02-19 DIAGNOSIS — Z7902 Long term (current) use of antithrombotics/antiplatelets: Secondary | ICD-10-CM | POA: Diagnosis not present

## 2023-02-19 DIAGNOSIS — Z79899 Other long term (current) drug therapy: Secondary | ICD-10-CM | POA: Diagnosis not present

## 2023-02-19 DIAGNOSIS — K649 Unspecified hemorrhoids: Secondary | ICD-10-CM | POA: Diagnosis present

## 2023-02-19 DIAGNOSIS — N179 Acute kidney failure, unspecified: Secondary | ICD-10-CM | POA: Diagnosis present

## 2023-02-19 DIAGNOSIS — E7800 Pure hypercholesterolemia, unspecified: Secondary | ICD-10-CM | POA: Diagnosis present

## 2023-02-19 DIAGNOSIS — Z23 Encounter for immunization: Secondary | ICD-10-CM | POA: Diagnosis present

## 2023-02-19 LAB — BASIC METABOLIC PANEL
Anion gap: 9 (ref 5–15)
BUN: 41 mg/dL — ABNORMAL HIGH (ref 8–23)
CO2: 19 mmol/L — ABNORMAL LOW (ref 22–32)
Calcium: 8.5 mg/dL — ABNORMAL LOW (ref 8.9–10.3)
Chloride: 113 mmol/L — ABNORMAL HIGH (ref 98–111)
Creatinine, Ser: 2.18 mg/dL — ABNORMAL HIGH (ref 0.44–1.00)
GFR, Estimated: 21 mL/min — ABNORMAL LOW (ref >60)
Glucose, Bld: 83 mg/dL (ref 70–99)
Potassium: 3.6 mmol/L (ref 3.5–5.1)
Sodium: 141 mmol/L (ref 135–145)

## 2023-02-19 LAB — C DIFFICILE QUICK SCREEN W PCR REFLEX
C Diff antigen: NEGATIVE
C Diff interpretation: NOT DETECTED
C Diff toxin: NEGATIVE

## 2023-02-19 MED ORDER — LACTATED RINGERS IV SOLN: INTRAVENOUS | Status: AC

## 2023-02-19 NOTE — Progress Notes (Signed)
Progress Note   Patient: Sharon Barajas IEP:[id number] DOB: 03/09/1932 DOA: 02/18/2023     0 DOS: the patient was seen and examined on 02/19/2023   Brief hospital course: HPI on admission 02/18/2023: Sharon Barajas is a 89 y.o. female with medical history significant of CKD stage IV, HTN, chronic HFpEF, HLD, carotid stenosis on Plavix, brought in by family member for evaluation of sudden onset of watery diarrhea.... "  See H&P for full HPI on admission and ED course.  Pt was admitted for further evaluation and management of Acute Kidney Injury, started on IV fluids, and stool studies ordered.  Further hospital course and management as outlined below.   Assessment and Plan:  AKI on CKD stage IV - due to hypovolemia from diarrhea Cr on admission 2.29 >> 2.18 (baseline 1.3--1.5) --Continue IV hydration --Renally dose meds & avoid nephrotoxins, hypotension --Daily BMP's to monitor    Watery diarrhea - suspect "post-obstructive" following resolution of constipation, in addition to baseline chronic diarrhea. C diff Negative --D/C Enteric precautions --Follow stool cultures Low suspicion for infectious etiology   Hypertension On admission, BP low & antihypertensives were held. (amlodipine hydralazine and Coreg) --PRN hydralazine for now --Gradually resume home meds as BP tolerates   Chronic diarrhea Weight loss Moderate protein calorie malnutrition Chronic diarrhea likely related to cholecystectomy, pt already on cholestyramine. Family report pt had been constipated, given prune juice and ?stool softener and then developed diarrhea when constipation resolved.  Other differentials include malabsorption.   --Recommend consideration for outpatient workup for pancreatic insufficiency. --Calorie count and consult dietitian --Liberalize diet    Chronic HFpEF - hypovolemic due to diarrhea, with AKI on admission --Hold Lasix --Monitor volume status   Physical  Deconditioning --PT OT evaluations      Subjective: Pt up in recliner, two granddaughters at bedside this AM.  Pt very hard of hearing, but can hear if you speak loudly in right ear.  She reports feeling better.  Has no acute complaints.     Physical Exam: Vitals:   02/18/23 1922 02/18/23 2100 02/18/23 2200 02/19/23 0829  BP:  (!) 101/48 118/60 (!) 118/52  Pulse:  90 83 78  Resp:  16 18 15   Temp:  98.2 F (36.8 C) 98.1 F (36.7 C) 98 F (36.7 C)  TempSrc:   Oral   SpO2:  99% 100% 95%  Weight: 42 kg     Height: 4\' 10"  (1.473 m)      General exam: awake, alert, no acute distress, frail, underweight HEENT: moist mucus membranes, very hard of hearing  Respiratory system: CTAB, no wheezes, rales or rhonchi, normal respiratory effort. Cardiovascular system: normal S1/S2, RRR, no pedal edema.   Gastrointestinal system: soft, NT, ND, no HSM felt, +bowel sounds. Central nervous system: no gross focal neurologic deficits besides hearing loss, normal speech Extremities: moves all, no edema, normal tone Skin: dry, intact, normal temperature Psychiatry: normal mood, congruent affect   Data Reviewed:  Notable labs ---  Cl 113 Bicarb 19 BUN 45 >> 41 Cr 2.29 >> 2.18 Ca 8.5 Lipase yesterday mildly elevated 56 WBC 11.1 Hbg 10.2  C diff - negative antigen and toxin  Pending - stool culture   Family Communication: 2 granddaughters at bedside on rounds  Disposition: Status is: Observation The patient remains OBS appropriate and will d/c before 2 midnights.  Remains admitted on IV fluids pending further improvement in renal function.   Planned Discharge Destination: Home with Home Health    Time spent:  42 minutes  Author: Pennie Banter, DO 02/19/2023 2:21 PM  For on call review [email or web address].

## 2023-02-19 NOTE — Evaluation (Signed)
Occupational Therapy Evaluation Patient Details Name: Sharon Barajas MRN: [id number] DOB: 28-Feb-1932 Today's Date: 02/19/2023   History of Present Illness Pt is an 89 y.o. female with medical history significant of CKD stage IV, HTN, chronic HFpEF, HLD, carotid stenosis on Plavix, brought in by family member for evaluation of sudden onset of watery diarrhea, very HOH.   Clinical Impression   Pt was seen for OT evaluation this date. Prior to hospital admission, pt was requiring some assist for ADL and ambulating with RW and supv. Pt presents to acute OT demonstrating impaired ADL performance and functional mobility 2/2 decreased strength, balance, and activity tolerance (See OT problem list for additional functional deficits). Pt currently requires MODA for pericare thoroughness, MIN A for LB ADL tasks, CGA for ADL transfers with RW. Pt would benefit from skilled OT services to address noted impairments and functional limitations (see below for any additional details) in order to maximize safety and independence while minimizing falls risk and caregiver burden.     If plan is discharge home, recommend the following: A little help with walking and/or transfers;A little help with bathing/dressing/bathroom;Assistance with cooking/housework;Assist for transportation;Help with stairs or ramp for entrance;Direct supervision/assist for medications management    Functional Status Assessment  Patient has had a recent decline in their functional status and demonstrates the ability to make significant improvements in function in a reasonable and predictable amount of time.  Equipment Recommendations  None recommended by OT    Recommendations for Other Services       Precautions / Restrictions Precautions Precautions: Fall Restrictions Weight Bearing Restrictions: No      Mobility Bed Mobility               General bed mobility comments: NT, EOB with PT upon OT arrival     Transfers Overall transfer level: Needs assistance Equipment used: Rolling walker (2 wheels), 1 person hand held assist Transfers: Sit to/from Stand, Bed to chair/wheelchair/BSC Sit to Stand: Min assist, Contact guard assist Stand pivot transfers: Contact guard assist                Balance Overall balance assessment: Needs assistance Sitting-balance support: Feet supported Sitting balance-Leahy Scale: Good Sitting balance - Comments: able to perform pericare but needed assistance to ensure clealiness   Standing balance support: Bilateral upper extremity supported, Single extremity supported, Reliant on assistive device for balance, During functional activity Standing balance-Leahy Scale: Poor Standing balance comment: required BUE support on RW in standing during pericare                           ADL either performed or assessed with clinical judgement   ADL Overall ADL's : Needs assistance/impaired                 Upper Body Dressing : Sitting;Minimal assistance   Lower Body Dressing: Sit to/from stand;Moderate assistance   Toilet Transfer: Contact guard assist;BSC/3in1;Rolling walker (2 wheels)   Toileting- Clothing Manipulation and Hygiene: Moderate assistance;Sit to/from stand;Sitting/lateral lean Toileting - Clothing Manipulation Details (indicate cue type and reason): setup and sup v for seated pericare but required MODA  in standing for thoroughness     Functional mobility during ADLs: Rolling walker (2 wheels);Contact guard assist       Vision         Perception         Praxis         Pertinent Vitals/Pain Pain Assessment  Pain Assessment: No/denies pain     Extremity/Trunk Assessment Upper Extremity Assessment Upper Extremity Assessment: Generalized weakness   Lower Extremity Assessment Lower Extremity Assessment: Generalized weakness       Communication Communication Communication: Hearing impairment   Cognition  Arousal: Alert Behavior During Therapy: WFL for tasks assessed/performed Overall Cognitive Status: Within Functional Limits for tasks assessed                                 General Comments: extremely HOH     General Comments       Exercises     Shoulder Instructions      Home Living Family/patient expects to be discharged to:: Private residence Living Arrangements: Other relatives (sisters rotate as needed for 24/7 supervision) Available Help at Discharge: Family;Available 24 hours/day (sisters house now) Type of Home: House Home Access: Other (comment);Stairs to enter [employer] of Steps: 1 + 1 uses door frame, has assist for stairs   Home Layout: One level     Bathroom Shower/Tub: Producer, television/film/video: Standard     Home Equipment: Pharmacist, hospital (2 wheels);Toilet riser   Additional Comments: no falls in last 6 months      Prior Functioning/Environment Prior Level of Function : Independent/Modified Independent             Mobility Comments: RW in home with someone present ADLs Comments: assists now for dressing/bathing, IADLs from family        OT Problem List: Decreased strength;Decreased activity tolerance;Impaired balance (sitting and/or standing);Decreased knowledge of use of DME or AE      OT Treatment/Interventions: Self-care/ADL training;Therapeutic exercise;Therapeutic activities;Patient/family education;DME and/or AE instruction;Energy conservation;Balance training    OT Goals(Current goals can be found in the care plan section) Acute Rehab OT Goals Patient Stated Goal: go home OT Goal Formulation: With patient/family Time For Goal Achievement: 03/05/23 Potential to Achieve Goals: Good ADL Goals Pt Will Perform Upper Body Dressing: sitting;with set-up;with supervision Pt Will Perform Lower Body Dressing: sit to/from stand;with caregiver independent in assisting Pt Will Transfer to Toilet:  with supervision;ambulating (LRAD) Pt Will Perform Toileting - Clothing Manipulation and hygiene: with min assist (MIN A for thoroughness)  OT Frequency: Min 1X/week    Co-evaluation              AM-PAC OT "6 Clicks" Daily Activity     Outcome Measure Help from another person eating meals?: None Help from another person taking care of personal grooming?: A Little Help from another person toileting, which includes using toliet, bedpan, or urinal?: A Lot Help from another person bathing (including washing, rinsing, drying)?: A Lot Help from another person to put on and taking off regular upper body clothing?: A Little Help from another person to put on and taking off regular lower body clothing?: A Lot 6 Click Score: 16   End of Session Nurse Communication: Mobility status  Activity Tolerance: Patient tolerated treatment well Patient left: in chair;with call bell/phone within reach;with chair alarm set;with family/visitor present  OT Visit Diagnosis: Other abnormalities of gait and mobility (R26.89);Muscle weakness (generalized) (M62.81)                Time: 2025-4270 OT Time Calculation (min): 18 min Charges:  OT General Charges $OT Visit: 1 Visit OT Evaluation $OT Eval Low Complexity: 1 Low  Arman Filter., MPH, MS, OTR/L ascom [phone] 02/19/23, 2:23 PM

## 2023-02-19 NOTE — Evaluation (Signed)
Physical Therapy Evaluation Patient Details Name: Sharon Barajas MRN: [id number] DOB: [birth year] Today's Date: 02/19/2023  History of Present Illness  Pt is an 89 y.o. female with medical history significant of CKD stage IV, HTN, chronic HFpEF, HLD, carotid stenosis on Plavix, brought in by family member for evaluation of sudden onset of watery diarrhea, very HOH.   Clinical Impression  Patient alert and oriented to self, place, family in room  but very HOH. Family provided PLOF; pt now living with her sister where family provides 24/7 supervision/assistance. Pt has supervision for all ambulation/mobility, and assistance for all ADLs as needed though family lets her do as much as she can to be as independent as possible. Pt noted for BM in bed, minA to rolling to start perciare, but ultimately minA to EOB and to [facility] via handheld assist. OT in room to assist with mobility/ADLs. She was able to stand with RW and CGA, 1 LOB with minA to correct. She was able to ambulate ~31ft in room with CGA. Up in room with needs in reach.  Overall the patient demonstrated deficits (see "PT Problem List") that impede the patient's functional abilities, safety, and mobility and would benefit from skilled PT intervention.            If plan is discharge home, recommend the following: A little help with walking and/or transfers;A little help with bathing/dressing/bathroom;Assistance with cooking/housework;Assist for transportation;Direct supervision/assist for medications management;Help with stairs or ramp for entrance;Assistance with feeding   Can travel by private vehicle        Equipment Recommendations None recommended by PT  Recommendations for Other Services       Functional Status Assessment Patient has had a recent decline in their functional status and demonstrates the ability to make significant improvements in function in a reasonable and predictable amount of time.     Precautions /  Restrictions Precautions Precautions: Fall Restrictions Weight Bearing Restrictions: No      Mobility  Bed Mobility Overal bed mobility: Needs Assistance Bed Mobility: Supine to Sit     Supine to sit: Min assist          Transfers Overall transfer level: Needs assistance Equipment used: Rolling walker (2 wheels), 1 person hand held assist Transfers: Sit to/from Stand, Bed to chair/wheelchair/BSC Sit to Stand: Min assist, Contact guard assist Stand pivot transfers: Contact guard assist              Ambulation/Gait Ambulation/Gait assistance: Contact guard assist Gait Distance (Feet): 40 Feet Assistive device: Rolling walker (2 wheels)            Stairs            Wheelchair Mobility     Tilt Bed    Modified Rankin (Stroke Patients Only)       Balance Overall balance assessment: Needs assistance Sitting-balance support: Feet supported Sitting balance-Leahy Scale: Good Sitting balance - Comments: able to perform pericare but needed assistance to ensure clealiness                                     Pertinent Vitals/Pain Pain Assessment Pain Assessment: No/denies pain    Home Living Family/patient expects to be discharged to:: Private residence Living Arrangements: Other relatives (sisters rotate as needed for 24/7 supervision) Available Help at Discharge: Family;Available 24 hours/day (sisters house now) Type of Home: House Home Access: Other (comment);Stairs to enter  Entrance Stairs-Number of Steps: 1 + 1 uses door frame, has assist for stairs   Home Layout: One level Home Equipment: Pharmacist, hospital (2 wheels);Toilet riser Additional Comments: no falls in last 6 months    Prior Function Prior Level of Function : Independent/Modified Independent             Mobility Comments: RW in home with someone present ADLs Comments: assists now for dressing/bathing, IADLs from family     Extremity/Trunk  Assessment   Upper Extremity Assessment Upper Extremity Assessment: Generalized weakness    Lower Extremity Assessment Lower Extremity Assessment: Generalized weakness       Communication   Communication Communication: Hearing impairment  Cognition Arousal: Alert Behavior During Therapy: WFL for tasks assessed/performed Overall Cognitive Status: Within Functional Limits for tasks assessed                                 General Comments: pt oriented to self, place, family in room        General Comments      Exercises     Assessment/Plan    PT Assessment Patient needs continued PT services  PT Problem List Decreased strength;Decreased activity tolerance;Decreased balance;Decreased mobility       PT Treatment Interventions DME instruction;Neuromuscular re-education;Gait training;Stair training;Patient/family education;Functional mobility training;Therapeutic activities;Therapeutic exercise;Balance training    PT Goals (Current goals can be found in the Care Plan section)  Acute Rehab PT Goals Patient Stated Goal: to move around PT Goal Formulation: With family Time For Goal Achievement: 03/05/23 Potential to Achieve Goals: Good    Frequency Min 1X/week     Co-evaluation               AM-PAC PT "6 Clicks" Mobility  Outcome Measure Help needed turning from your back to your side while in a flat bed without using bedrails?: A Little Help needed moving from lying on your back to sitting on the side of a flat bed without using bedrails?: A Little Help needed moving to and from a bed to a chair (including a wheelchair)?: A Little Help needed standing up from a chair using your arms (e.g., wheelchair or bedside chair)?: A Little Help needed to walk in hospital room?: A Little Help needed climbing 3-5 steps with a railing? : A Little 6 Click Score: 18    End of Session Equipment Utilized During Treatment: Gait belt Activity Tolerance: Patient  tolerated treatment well Patient left: with call bell/phone within reach;with chair alarm set;in chair;with family/visitor present Nurse Communication: Mobility status PT Visit Diagnosis: Other abnormalities of gait and mobility (R26.89);Muscle weakness (generalized) (M62.81);Difficulty in walking, not elsewhere classified (R26.2)    Time: 4403-4742 PT Time Calculation (min) (ACUTE ONLY): 29 min   Charges:   PT Evaluation $PT Eval Low Complexity: 1 Low PT Treatments $Therapeutic Activity: 8-22 mins PT General Charges $$ ACUTE PT VISIT: 1 Visit         Olga Coaster PT, DPT 1:00 PM,02/19/23

## 2023-02-19 NOTE — Plan of Care (Signed)

## 2023-02-20 ENCOUNTER — Ambulatory Visit: Payer: Medicare HMO | Admitting: Cardiology

## 2023-02-20 DIAGNOSIS — N179 Acute kidney failure, unspecified: Secondary | ICD-10-CM | POA: Diagnosis not present

## 2023-02-20 DIAGNOSIS — E43 Unspecified severe protein-calorie malnutrition: Secondary | ICD-10-CM | POA: Insufficient documentation

## 2023-02-20 LAB — CBC
HCT: 23.5 % — ABNORMAL LOW (ref 36.0–46.0)
Hemoglobin: 7.3 g/dL — ABNORMAL LOW (ref 12.0–15.0)
MCH: 30.2 pg (ref 26.0–34.0)
MCHC: 31.1 g/dL (ref 30.0–36.0)
MCV: 97.1 fL (ref 80.0–100.0)
Platelets: 145 10*3/uL — ABNORMAL LOW (ref 150–400)
RBC: 2.42 MIL/uL — ABNORMAL LOW (ref 3.87–5.11)
RDW: 13.8 % (ref 11.5–15.5)
WBC: 5 10*3/uL (ref 4.0–10.5)
nRBC: 0 % (ref 0.0–0.2)

## 2023-02-20 LAB — BASIC METABOLIC PANEL
Anion gap: 5 (ref 5–15)
BUN: 44 mg/dL — ABNORMAL HIGH (ref 8–23)
CO2: 21 mmol/L — ABNORMAL LOW (ref 22–32)
Calcium: 8 mg/dL — ABNORMAL LOW (ref 8.9–10.3)
Chloride: 116 mmol/L — ABNORMAL HIGH (ref 98–111)
Creatinine, Ser: 1.78 mg/dL — ABNORMAL HIGH (ref 0.44–1.00)
GFR, Estimated: 27 mL/min — ABNORMAL LOW (ref >60)
Glucose, Bld: 97 mg/dL (ref 70–99)
Potassium: 3.4 mmol/L — ABNORMAL LOW (ref 3.5–5.1)
Sodium: 142 mmol/L (ref 135–145)

## 2023-02-20 LAB — HEMOGLOBIN AND HEMATOCRIT, BLOOD
HCT: 24.5 % — ABNORMAL LOW (ref 36.0–46.0)
Hemoglobin: 7.9 g/dL — ABNORMAL LOW (ref 12.0–15.0)

## 2023-02-20 LAB — LIPASE, BLOOD: Lipase: 108 U/L — ABNORMAL HIGH (ref 11–51)

## 2023-02-20 MED ORDER — POTASSIUM CHLORIDE CRYS ER 20 MEQ PO TBCR
40.0000 meq | EXTENDED_RELEASE_TABLET | Freq: Once | ORAL | Status: AC
Start: 2023-02-20 — End: 2023-02-20
  Administered 2023-02-20: 40 meq via ORAL
  Filled 2023-02-20: qty 2

## 2023-02-20 MED ORDER — PNEUMOCOCCAL 20-VAL CONJ VACC 0.5 ML IM SUSY
0.5000 mL | PREFILLED_SYRINGE | INTRAMUSCULAR | Status: AC
  Administered 2023-02-21: 0.5 mL via INTRAMUSCULAR
  Filled 2023-02-20: qty 0.5

## 2023-02-20 MED ORDER — INFLUENZA VAC A&B SURF ANT ADJ 0.5 ML IM SUSY
0.5000 mL | PREFILLED_SYRINGE | INTRAMUSCULAR | Status: AC
  Administered 2023-02-21: 0.5 mL via INTRAMUSCULAR
  Filled 2023-02-20: qty 0.5

## 2023-02-20 NOTE — Progress Notes (Signed)
Initial Nutrition Assessment  DOCUMENTATION CODES:   Severe malnutrition in context of chronic illness  INTERVENTION:   -Continue regular diet -MVI with minerals daily -Continue Ensure Enlive po BID, each supplement provides 350 kcal and 20 grams of protein.   NUTRITION DIAGNOSIS:   Severe Malnutrition related to chronic illness (CKD, CHF) as evidenced by moderate fat depletion, severe fat depletion, moderate muscle depletion, severe muscle depletion.  GOAL:   Patient will meet greater than or equal to 90% of their needs  MONITOR:   PO intake, Supplement acceptance  REASON FOR ASSESSMENT:   Consult Assessment of nutrition requirement/status  ASSESSMENT:   Pt with medical history significant of CKD stage IV, HTN, chronic HFpEF, HLD, carotid stenosis on Plavix, brought in for evaluation of sudden onset of watery diarrhea.  Pt admitted with AKI on CKD stage IV.   Reviewed I/O's: +1.1 L x 24 hours and +2.1 L since admission   Pt is very hard of hearing. She deferred most of the interview to her granddaughter at bedside. Granddaughter reports that pt had experienced a general decline in health in September 2024 secondary to chronic diarrhea and poor oral intake. Pt recently moved into her sister's house in October and pt's health has improved dramatically due to this. Pt's sisters and granddaughter all assist with care. Diarrhea has also improved greatly; daughter shares that there was questionable medication compliance prior to pt moving in with her sister.   Pt has a very good appetite. Noted meal completions 30-80%. Pt consumes 3 meals per day PTA and also drinks 1-2 Boost supplements per day. Pt does not have teeth and does not tolerate hard foods, such as bacon. Granddaughter reports she would like to continue regular diet for widest variety of meal selections; family members to order meals to select foods that pt likes and can tolerate.   Granddaughter unsure of weight loss,  but suspect trending up since move. Wt has been stable over the past 2 months.   Discussed importance of good meal and supplement intake to promote healing. Pt amenable to continue supplements.   Medications reviewed and include questran and protonix.   Labs reviewed: K: 3.4, CBGS: 134.   NUTRITION - FOCUSED PHYSICAL EXAM:  Flowsheet Row Most Recent Value  Orbital Region Moderate depletion  Upper Arm Region Severe depletion  Thoracic and Lumbar Region Severe depletion  Buccal Region Moderate depletion  Temple Region Moderate depletion  Clavicle Bone Region Severe depletion  Clavicle and Acromion Bone Region Severe depletion  Scapular Bone Region Severe depletion  Dorsal Hand Severe depletion  Patellar Region Moderate depletion  Anterior Thigh Region Moderate depletion  Posterior Calf Region Moderate depletion  Edema (RD Assessment) None  Hair Reviewed  Eyes Reviewed  Mouth Reviewed  Skin Reviewed  Nails Reviewed       Diet Order:   Diet Order             Diet regular Room service appropriate? Yes; Fluid consistency: Thin  Diet effective now                   EDUCATION NEEDS:   Education needs have been addressed  Skin:  Skin Assessment: Reviewed RN Assessment  Last BM:  02/20/23 (type 6)  Height:   Ht Readings from Last 1 Encounters:  02/18/23 4\' 10"  (1.473 m)    Weight:   Wt Readings from Last 1 Encounters:  02/18/23 42 kg    Ideal Body Weight:  44.5 kg  BMI:  Body mass index is 19.35 kg/m.  Estimated Nutritional Needs:   Kcal:  1250-1450  Protein:  65-80 grams  Fluid:  > 1.2 L    Levada Schilling, RD, LDN, CDCES Registered Dietitian III Certified Diabetes Care and Education Specialist Please refer to [facility] for RD and/or RD on-call/weekend/after hours pager

## 2023-02-20 NOTE — Progress Notes (Signed)
   02/20/23 1400  Spiritual Encounters  Type of Visit Initial  Care provided to: Patient  Referral source Patient request  Reason for visit Advance directives  OnCall Visit No   Chaplain visited patient in regards to Advanced Directive request.  Patient is sleeping.  Chaplain left documents bedside.  Will pass to [employer] for continued attempt with completion.  Follow as appropriate and needed.

## 2023-02-20 NOTE — Progress Notes (Addendum)
Progress Note   Patient: Sharon Barajas ZOX:[id number] DOB: 28-Aug-1931 DOA: 02/18/2023     1 DOS: the patient was seen and examined on 02/20/2023   Brief hospital course: HPI on admission 02/18/2023: Sharon Barajas is a 89 y.o. female with medical history significant of CKD stage IV, HTN, chronic HFpEF, HLD, carotid stenosis on Plavix, brought in by family member for evaluation of sudden onset of watery diarrhea.... "  See H&P for full HPI on admission and ED course.  Pt was admitted for further evaluation and management of Acute Kidney Injury, started on IV fluids, and stool studies ordered.  Further hospital course and management as outlined below.   Assessment and Plan:  AKI on CKD stage IV - due to hypovolemia from diarrhea Cr on admission 2.29 >> 2.18 >> 1.78 (baseline 1.3--1.5) --Continue IV hydration --Renally dose meds & avoid nephrotoxins, hypotension --Daily BMP's to monitor    Watery diarrhea - seems resolved Suspect "post-obstructive" following resolution of constipation, in addition to baseline chronic diarrhea. C diff Negative --D/C Enteric precautions --Follow stool cultures Low suspicion for infectious etiology  Elevated Lipase - without clinical signs of pancreatitis.  But given hx of chronic intermittent diarrhea, pt could have pancreatic insufficiency. --Repeat lipase with AM labs --Continue regular diet as tolerated --Monitor clinically  Hemodilution -- WBC, Hbg and platelets are down this AM, pt has been on IV fluids, likely dilutional. --Repeat CBC in AM   Hypertension On admission, BP low & antihypertensives were held. (amlodipine hydralazine and Coreg) --PRN hydralazine for now --Gradually resume home meds as BP tolerates   Chronic diarrhea Weight loss Severe protein calorie malnutrition Chronic diarrhea likely related to cholecystectomy, pt already on cholestyramine. Family report pt had been constipated, given prune juice and ?stool  softener and then developed diarrhea when constipation resolved.  Other differentials include malabsorption.   --Recommend consideration for outpatient workup for pancreatic insufficiency. --Dietitian consulted - appreciate recommendations --Liberalize diet    Chronic HFpEF - hypovolemic due to diarrhea, with AKI on admission --Hold Lasix --Monitor volume status   Physical Deconditioning --PT OT evaluations      Subjective: Pt sleeping soundly, granddaughter at bedside this AM.  Rn and granddaughter report pt tolerating regular diet without complaints of abdominal pain or nausea/vomiting.  Granddaughter reports she ate a really good breakfast this AM.     Physical Exam: Vitals:   02/19/23 1725 02/20/23 0051 02/20/23 0758 02/20/23 1355  BP: 126/60 102/70 129/61   Pulse: 87 78 78   Resp: 15 18 16 18   Temp: 97.9 F (36.6 C) 99.3 F (37.4 C) 97.7 F (36.5 C)   TempSrc:   Oral   SpO2: 100% 97% 97%   Weight:      Height:       General exam: sleeping soundly, no acute distress, frail, underweight HEENT: moist mucus membranes, very hard of hearing  Respiratory system: on room air, symmetric chest rise, normal respiratory effort. Cardiovascular system: RRR, no pedal edema.   Central nervous system: exam limited due to somnolence Extremities: moves all, no edema, normal tone Psychiatry: exam limited due to somnolence   Data Reviewed:  Notable labs ---  K 3.4 Cl 116 Bicarb 19 >> 21 BUN 45 >> 41 >> 44  Cr 2.29 >> 2.18 >> 1.78 Ca 8.0 Lipase 56 >> 102 (but zero symptoms of pancreatitis)  CBC appears hemo-diluted WBC 11.1 >> 5.0 Hbg 10.2 >> 7.3  (no signs of bleeding) Platelets 194 >> 145k  C  diff - negative antigen and toxin  Pending - stool culture   Family Communication:  granddaughter at bedside on rounds  Disposition: Status is: Inpatient Remains inpatient appropriate because: persistent AKI, remains on IV therapies as above    Remains admitted on IV  fluids pending further improvement in renal function.   Planned Discharge Destination: Home with Home Health    Time spent: 42 minutes  Author: Pennie Banter, DO 02/20/2023 2:26 PM  For on call review [email or web address].

## 2023-02-20 NOTE — Progress Notes (Signed)
Attempted visit last night and this morning to educate on HCPOA. Pt was resting and this morning busy with staff in room. Will pass on to daytime chaplain for follow up and paperwork

## 2023-02-20 NOTE — Progress Notes (Signed)
Physical Therapy Treatment Patient Details Name: Sharon Barajas MRN: [id number] DOB: Nov 12, 1931 Today's Date: 02/20/2023   History of Present Illness Pt is an 89 y.o. female with medical history significant of CKD stage IV, HTN, chronic HFpEF, HLD, carotid stenosis on Plavix, brought in by family member for evaluation of sudden onset of watery diarrhea, very HOH.  MD assessment includes AKI, elevated lipase, and deconditioning.    PT Comments  Pt was pleasant and motivated to participate during the session and put forth good effort throughout. Pt very HOH and per family hears better in her R ear.  Pt required extra time and effort with bed mobility tasks but no physical assistance and only min A to come to standing from the bed in the lowest height position.  Once in standing pt presented with forward trunk flexed position but was steady with gait and was motivated to amb 150 feet before returning to her room with no adverse symptoms.  Pt will benefit from continued PT services upon discharge to safely address deficits listed in patient problem list for decreased caregiver assistance and eventual return to PLOF.      If plan is discharge home, recommend the following: A little help with walking and/or transfers;A little help with bathing/dressing/bathroom;Assistance with cooking/housework;Assist for transportation;Direct supervision/assist for medications management;Help with stairs or ramp for entrance;Assistance with feeding   Can travel by private vehicle        Equipment Recommendations  None recommended by PT    Recommendations for Other Services       Precautions / Restrictions Precautions Precautions: Fall Restrictions Weight Bearing Restrictions: No     Mobility  Bed Mobility Overal bed mobility: Modified Independent             General bed mobility comments: Min extra time and effort only during sup to sit    Transfers Overall transfer level: Needs  assistance Equipment used: Rolling walker (2 wheels) Transfers: Sit to/from Stand Sit to Stand: Min assist           General transfer comment: Pt required multiple rocking attempts and ultimately min A to come to standing from bed in lowest position    Ambulation/Gait Ambulation/Gait assistance: Contact guard assist Gait Distance (Feet): 150 Feet x 1, 15 feet x 1 Assistive device: Rolling walker (2 wheels) Gait Pattern/deviations: Step-through pattern, Decreased step length - right, Decreased step length - left, Trunk flexed Gait velocity: decreased     General Gait Details: Slow cadence with trunk flexed but generally steady with no overt LOB   Stairs             Wheelchair Mobility     Tilt Bed    Modified Rankin (Stroke Patients Only)       Balance Overall balance assessment: Needs assistance Sitting-balance support: Feet supported Sitting balance-Leahy Scale: Good     Standing balance support: Bilateral upper extremity supported, During functional activity Standing balance-Leahy Scale: Fair                              Cognition Arousal: Alert Behavior During Therapy: WFL for tasks assessed/performed Overall Cognitive Status: Within Functional Limits for tasks assessed                                 General Comments: extremely HOH, hears better in her R ear  Exercises      General Comments        Pertinent Vitals/Pain Pain Assessment Pain Assessment: No/denies pain    Home Living                          Prior Function            PT Goals (current goals can now be found in the care plan section) Progress towards PT goals: Progressing toward goals    Frequency    Min 1X/week      PT Plan      Co-evaluation              AM-PAC PT "6 Clicks" Mobility   Outcome Measure  Help needed turning from your back to your side while in a flat bed without using bedrails?: None Help  needed moving from lying on your back to sitting on the side of a flat bed without using bedrails?: None Help needed moving to and from a bed to a chair (including a wheelchair)?: A Little Help needed standing up from a chair using your arms (e.g., wheelchair or bedside chair)?: A Little Help needed to walk in hospital room?: A Little Help needed climbing 3-5 steps with a railing? : A Little 6 Click Score: 20    End of Session Equipment Utilized During Treatment: Gait belt Activity Tolerance: Patient tolerated treatment well Patient left: in chair;with call bell/phone within reach;with chair alarm set;with family/visitor present Nurse Communication: Mobility status PT Visit Diagnosis: Other abnormalities of gait and mobility (R26.89);Muscle weakness (generalized) (M62.81);Difficulty in walking, not elsewhere classified (R26.2)     Time: 8119-1478 PT Time Calculation (min) (ACUTE ONLY): 16 min  Charges:    $Gait Training: 8-22 mins PT General Charges $$ ACUTE PT VISIT: 1 Visit                     D. Scott Aija Scarfo PT, DPT 02/20/23, 4:25 PM

## 2023-02-20 NOTE — TOC Progression Note (Signed)
Transition of Care [facility]) - Progression Note    Patient Details  Name: Sharon Barajas MRN: [id number] Date of Birth: February 12, 1932  Transition of [facility]) CM/SW Contact  Marlowe Sax, RN Phone Number: 02/20/2023, 2:57 PM  Clinical Narrative:     Spoke with the patient's Grand daughter Angelica Chessman, she confirmed that the patient is staying with her sister and will continue they had [facility] for [facility] in the past and want them again, the patient has DME at home and does not need additional, I notified cory at [facility]  Expected Discharge Plan: Home w Home Health Services Barriers to Discharge: Continued Medical Work up  Expected Discharge Plan and Services   Discharge Planning Services: CM Consult   Living arrangements for the past 2 months: Single Family Home                 DME Arranged: N/A DME Agency: NA Date DME Agency Contacted: 02/20/23 Time DME Agency Contacted: 1453   HH Arranged: PT, OT, RN HH Agency: [facility] Date [facility] Agency Contacted: 02/13/23 Time HH Agency Contacted: 1454 Representative spoke with at [facility] Agency: Kandee Keen   Social Determinants of Health (SDOH) Interventions SDOH Screenings   Food Insecurity: No Food Insecurity (02/19/2023)  Housing: Low Risk  (02/19/2023)  Transportation Needs: No Transportation Needs (02/19/2023)  Utilities: Not At Risk (02/19/2023)  Financial Resource Strain: Low Risk  (11/16/2022)   Received from [facility]  Tobacco Use: Low Risk  (02/18/2023)    Readmission Risk Interventions     No data to display

## 2023-02-21 ENCOUNTER — Encounter: Payer: Self-pay | Admitting: Internal Medicine

## 2023-02-21 DIAGNOSIS — N179 Acute kidney failure, unspecified: Secondary | ICD-10-CM | POA: Diagnosis not present

## 2023-02-21 LAB — CBC
HCT: 24.3 % — ABNORMAL LOW (ref 36.0–46.0)
Hemoglobin: 7.6 g/dL — ABNORMAL LOW (ref 12.0–15.0)
MCH: 30.4 pg (ref 26.0–34.0)
MCHC: 31.3 g/dL (ref 30.0–36.0)
MCV: 97.2 fL (ref 80.0–100.0)
Platelets: 158 10*3/uL (ref 150–400)
RBC: 2.5 MIL/uL — ABNORMAL LOW (ref 3.87–5.11)
RDW: 13.9 % (ref 11.5–15.5)
WBC: 4.6 10*3/uL (ref 4.0–10.5)
nRBC: 0 % (ref 0.0–0.2)

## 2023-02-21 LAB — RENAL FUNCTION PANEL
Albumin: 2.6 g/dL — ABNORMAL LOW (ref 3.5–5.0)
Anion gap: 8 (ref 5–15)
BUN: 37 mg/dL — ABNORMAL HIGH (ref 8–23)
CO2: 18 mmol/L — ABNORMAL LOW (ref 22–32)
Calcium: 8.5 mg/dL — ABNORMAL LOW (ref 8.9–10.3)
Chloride: 118 mmol/L — ABNORMAL HIGH (ref 98–111)
Creatinine, Ser: 1.69 mg/dL — ABNORMAL HIGH (ref 0.44–1.00)
GFR, Estimated: 29 mL/min — ABNORMAL LOW (ref >60)
Glucose, Bld: 96 mg/dL (ref 70–99)
Phosphorus: 2.3 mg/dL — ABNORMAL LOW (ref 2.5–4.6)
Potassium: 3.8 mmol/L (ref 3.5–5.1)
Sodium: 144 mmol/L (ref 135–145)

## 2023-02-21 LAB — MAGNESIUM: Magnesium: 2.3 mg/dL (ref 1.7–2.4)

## 2023-02-21 LAB — LIPASE, BLOOD: Lipase: 92 U/L — ABNORMAL HIGH (ref 11–51)

## 2023-02-21 MED ORDER — RISAQUAD PO CAPS: 2.0000 | ORAL_CAPSULE | Freq: Every day | ORAL | Status: DC

## 2023-02-21 MED ORDER — LOPERAMIDE HCL 2 MG PO CAPS: 2.0000 mg | ORAL_CAPSULE | ORAL | Status: DC | PRN

## 2023-02-21 MED ORDER — PSYLLIUM 95 % PO PACK
1.0000 | PACK | Freq: Every day | ORAL | Status: DC
  Administered 2023-02-21: 1 via ORAL
  Filled 2023-02-21: qty 1

## 2023-02-21 MED ORDER — ENSURE ENLIVE PO LIQD: 237.0000 mL | Freq: Two times a day (BID) | ORAL | Status: AC

## 2023-02-21 MED ORDER — ZINC OXIDE 40 % EX OINT: TOPICAL_OINTMENT | CUTANEOUS | Status: DC | PRN

## 2023-02-21 MED ORDER — PSYLLIUM 95 % PO PACK: 1.0000 | PACK | Freq: Every day | ORAL | Status: DC

## 2023-02-21 NOTE — Progress Notes (Signed)
  Chaplain On-Call received referral from Chaplain Resident Sharon Barajas regarding the patient's request yesterday for Advance Directives information.  Chaplain Barajas had left the Advance Directives packet for the patient, who was sleeping soundly at the time.  Sharon Barajas visited the patient on this day, and also met Sharon Barajas, the patient's granddaughter. Chaplain offered listening support as Sharon Barajas described the patient's weakening condition, and the plan for discharge today with the prospect of Home Health Nursing to be available.  Sharon Barajas also stated her role as the primary caregiver for the patient regarding medications and physician involvement. Her wish is to assist the patient in creating the [facility] document.  Chaplain provided education and the document, and advised Sharon Barajas to contact RN Sharon Barajas when the form is ready for completion.  Chaplain Sharon Barajas M.Div., [facility]

## 2023-02-21 NOTE — Plan of Care (Signed)

## 2023-02-21 NOTE — Plan of Care (Signed)

## 2023-02-21 NOTE — Discharge Summary (Signed)
Physician Discharge Summary   Patient: Sharon Barajas MRN: [id number] DOB: 1931-09-02  Admit date:     02/18/2023  Discharge date: 02/21/2023  Discharge Physician: Pennie Banter   PCP: [name], MD   Recommendations at discharge:   Follow up with Primary Care in 1-2 weeks Repeat CBC, BMP, Mg, Phos in 1-2 weeks Blood Pressure -- BP meds are held on d/c to prevent hypotension.  BP's were controlled and low-normal at times during admission with medications on hold.  Recommend home BP monitoring and close follow up with PCP.  Resume medications gradually. Plavix is held on d/c until repeat CBC - resume Plavix if Hbg is improved.   Hbg dipped into 7's after IV hydration, suspect dilution as there have been no signs of bleeding. Follow up Pending -- Fecal elastase to evaluate for pancreatic insufficiency.   Pt has elevated lipase levels but no clinical signs of pancreatitis.   Consider GI referral if indicated for further evaluation if having ongoing loose stools   Discharge Diagnoses: Active Problems:   Acute kidney injury superimposed on CKD (HCC)   Protein-calorie malnutrition, severe  Principal Problem (Resolved):   AKI (acute kidney injury) [facility]) Resolved Problems:   Diarrhea  Hospital Course:  HPI on admission 02/18/2023: Sharon Barajas is a 89 y.o. female with medical history significant of CKD stage IV, HTN, chronic HFpEF, HLD, carotid stenosis on Plavix, brought in by family member for evaluation of sudden onset of watery diarrhea.... "  See H&P for full HPI on admission and ED course.   Pt was admitted for further evaluation and management of Acute Kidney Injury, started on IV fluids, and stool studies ordered.   Further hospital course and management as outlined below.    11/19 -- pt doing well this AM.  Had a formed BM this AM, after recurrent diarrhea yesterday.  Pt denies complaints.  Eating and drinking well.  Pt is clinically improved and  medically stable for discharge home today.  Granddaughter at bedside in agreement, and questions answered.   Assessment and Plan:  AKI on CKD stage IV - due to hypovolemia from diarrhea Cr on admission 2.29 >> 2.18 >> 1.78 >> 1.69 (baseline 1.3--1.5) --Improved with IV hydration --Encourage PO hydration at home --Renally dose meds & avoid nephrotoxins, hypotension --Repeat BMP in 1 week   Watery diarrhea - seems resolved Suspect "post-obstructive" following resolution of constipation, in addition to baseline chronic diarrhea. C diff Negative --D/C Enteric precautions --Follow stool culture - pending Low suspicion for infectious etiology   Elevated Lipase - without clinical signs of pancreatitis.   Given hx of chronic intermittent diarrhea, pt could have pancreatic insufficiency. --Check fecal elastase -- pending --Continue regular diet as tolerated --Monitor clinically   Hemodilution -- WBC, Hbg and platelets are down this AM, pt has been on IV fluids, likely dilutional. --Repeat CBC in AM   Hypertension On admission, BP low & antihypertensives were held. (amlodipine hydralazine lasix and Coreg) BP's remain controlled overall and soft at times.   --Hold meds on discharge --Recommend home BP monitoring and close PCP follow up --Gradually resume home meds as BP tolerates   Chronic diarrhea Weight loss Severe protein calorie malnutrition Chronic diarrhea likely related to cholecystectomy, pt already on cholestyramine. Family report pt had been constipated, given prune juice and ?stool softener and then developed diarrhea when constipation resolved.   Other differentials include malabsorption, EPI.   --Fecal elastase ordered -- pending -- follow result --Consider further outpatient  workup for pancreatic insufficiency as needed --Dietitian consulted - appreciate recommendations --Liberalized diet    Chronic HFpEF - hypovolemic due to diarrhea, with AKI on admission --Hold  Lasix -- until PCP follow up.  Resume if BP will tolerate. --Monitor volume status   Physical Deconditioning --PT OT evaluations - HH recommended and arranged per TOC         Consultants: None Procedures performed: None  Disposition: Home health Diet recommendation:  Regular diet DISCHARGE MEDICATION: Allergies as of 02/21/2023   No Known Allergies      Medication List     STOP taking these medications    amLODipine 10 MG tablet Commonly known as: NORVASC   carvedilol 3.125 MG tablet Commonly known as: COREG   cefdinir 300 MG capsule Commonly known as: OMNICEF   clopidogrel 75 MG tablet Commonly known as: PLAVIX   escitalopram 10 MG tablet Commonly known as: LEXAPRO   furosemide 20 MG tablet Commonly known as: Lasix   hydrALAZINE 25 MG tablet Commonly known as: APRESOLINE   lisinopril 40 MG tablet Commonly known as: ZESTRIL   potassium chloride 10 MEQ tablet Commonly known as: KLOR-CON M       TAKE these medications    acetaminophen 325 MG tablet Commonly known as: TYLENOL Take 650 mg by mouth every 8 (eight) hours as needed for mild pain.   acidophilus Caps capsule Take 2 capsules by mouth daily. Start taking on: February 22, 2023   albuterol 108 (90 Base) MCG/ACT inhaler Commonly known as: VENTOLIN HFA Inhale 2 puffs into the lungs every 6 (six) hours as needed for wheezing.   atorvastatin 10 MG tablet Commonly known as: LIPITOR Take 10 mg by mouth daily.   Calcium Carbonate-Vitamin D 600-400 MG-UNIT tablet Take 1 tablet by mouth 2 (two) times daily with a meal.   cholestyramine 4 g packet Commonly known as: QUESTRAN Take 1 packet (4 g total) by mouth 2 (two) times daily.   feeding supplement Liqd Take 237 mLs by mouth 2 (two) times daily between meals.   hydrocortisone 2.5 % cream Apply 1 Application topically 3 (three) times daily.   ICaps Caps Take 1 capsule by mouth 2 (two) times daily.   liver oil-zinc oxide 40 %  ointment Commonly known as: DESITIN Apply topically as needed for irritation (rash).   loperamide 2 MG capsule Commonly known as: IMODIUM Take 1 capsule (2 mg total) by mouth as needed for diarrhea or loose stools.   nitroGLYCERIN 0.4 MG SL tablet Commonly known as: NITROSTAT Place 0.4 mg under the tongue every 5 (five) minutes as needed for chest pain.   pantoprazole 20 MG tablet Commonly known as: PROTONIX Take 20 mg by mouth 2 (two) times daily.   psyllium 95 % Pack Commonly known as: HYDROCIL/METAMUCIL Take 1 packet by mouth daily. Start taking on: February 22, 2023        Follow-up Information     [name], MD Follow up.   Specialty: Family Medicine Why: Office will call patient with date and time of next appointment Contact information: [address] [place] [address] [phone]                Discharge Exam: Ceasar Mons Weights   02/18/23 1922  Weight: 42 kg   General exam: awake, alert, no acute distress, frail HEENT: moist mucus membranes, hard of hearing  Respiratory system: CTAB, no wheezes, rales or rhonchi, normal respiratory effort. Cardiovascular system: normal S1/S2, RRR,  no pedal edema.  Gastrointestinal system: soft, NT, ND, no HSM felt, +bowel sounds. Central nervous system: A&O x self and hospital. no gross focal neurologic deficits, normal speech Extremities: moves all , no edema, normal tone Skin: dry, intact, normal temperature, normal color, No rashes, lesions or ulcers Psychiatry: normal mood, congruent affect, judgement and insight appear normal   Condition at discharge: stable  The results of significant diagnostics from this hospitalization (including imaging, microbiology, ancillary and laboratory) are listed below for reference.   Imaging Studies: No results found.  Microbiology: Results for orders placed or performed during the hospital encounter of 02/18/23  C Difficile Quick Screen w PCR reflex      Status: None   Collection Time: 02/19/23 10:04 AM   Specimen: STOOL  Result Value Ref Range Status   C Diff antigen NEGATIVE NEGATIVE Final   C Diff toxin NEGATIVE NEGATIVE Final   C Diff interpretation No C. difficile detected.  Final    Comment: Performed at [facility], [address] [place], [place] [address]    Labs: CBC: Recent Labs  Lab 02/18/23 1105 02/20/23 0413 02/20/23 1547 02/21/23 0551  WBC 11.1* 5.0  --  4.6  HGB 10.2* 7.3* 7.9* 7.6*  HCT 32.6* 23.5* 24.5* 24.3*  MCV 97.9 97.1  --  97.2  PLT 194 145*  --  158   Basic Metabolic Panel: Recent Labs  Lab 02/18/23 1105 02/19/23 0614 02/20/23 0413 02/21/23 0551  NA 141 141 142 144  K 3.8 3.6 3.4* 3.8  CL 107 113* 116* 118*  CO2 22 19* 21* 18*  GLUCOSE 120* 83 97 96  BUN 45* 41* 44* 37*  CREATININE 2.29* 2.18* 1.78* 1.69*  CALCIUM 9.2 8.5* 8.0* 8.5*  MG  --   --   --  2.3  PHOS  --   --   --  2.3*   Liver Function Tests: Recent Labs  Lab 02/18/23 1105 02/21/23 0551  AST 17  --   ALT 14  --   ALKPHOS 52  --   BILITOT 0.5  --   PROT 6.1*  --   ALBUMIN 3.5 2.6*   CBG: No results for input(s): "GLUCAP" in the last 168 hours.  Discharge time spent: less than 30 minutes.  Signed: Pennie Banter, DO [facility] 02/21/2023

## 2023-02-24 LAB — STOOL CULTURE: E coli, Shiga toxin Assay: NEGATIVE

## 2023-02-24 LAB — STOOL CULTURE REFLEX - CMPCXR

## 2023-02-24 LAB — STOOL CULTURE REFLEX - RSASHR

## 2023-02-27 DIAGNOSIS — R748 Abnormal levels of other serum enzymes: Secondary | ICD-10-CM | POA: Diagnosis not present

## 2023-02-27 DIAGNOSIS — N179 Acute kidney failure, unspecified: Secondary | ICD-10-CM | POA: Diagnosis not present

## 2023-03-31 DIAGNOSIS — N1832 Chronic kidney disease, stage 3b: Secondary | ICD-10-CM | POA: Diagnosis not present

## 2023-03-31 DIAGNOSIS — R0600 Dyspnea, unspecified: Secondary | ICD-10-CM | POA: Diagnosis not present

## 2023-03-31 DIAGNOSIS — I129 Hypertensive chronic kidney disease with stage 1 through stage 4 chronic kidney disease, or unspecified chronic kidney disease: Secondary | ICD-10-CM | POA: Diagnosis not present

## 2023-03-31 DIAGNOSIS — F411 Generalized anxiety disorder: Secondary | ICD-10-CM | POA: Diagnosis not present

## 2023-04-26 ENCOUNTER — Ambulatory Visit: Payer: Medicare HMO | Attending: Cardiology | Admitting: Cardiology

## 2023-07-03 DIAGNOSIS — N183 Chronic kidney disease, stage 3 unspecified: Secondary | ICD-10-CM | POA: Diagnosis not present

## 2023-07-03 DIAGNOSIS — Z8639 Personal history of other endocrine, nutritional and metabolic disease: Secondary | ICD-10-CM | POA: Diagnosis not present

## 2023-07-03 DIAGNOSIS — E782 Mixed hyperlipidemia: Secondary | ICD-10-CM | POA: Diagnosis not present

## 2023-07-10 DIAGNOSIS — Z8639 Personal history of other endocrine, nutritional and metabolic disease: Secondary | ICD-10-CM | POA: Diagnosis not present

## 2023-07-10 DIAGNOSIS — I129 Hypertensive chronic kidney disease with stage 1 through stage 4 chronic kidney disease, or unspecified chronic kidney disease: Secondary | ICD-10-CM | POA: Diagnosis not present

## 2023-07-10 DIAGNOSIS — N1832 Chronic kidney disease, stage 3b: Secondary | ICD-10-CM | POA: Diagnosis not present

## 2023-07-10 DIAGNOSIS — R300 Dysuria: Secondary | ICD-10-CM | POA: Diagnosis not present

## 2023-07-10 DIAGNOSIS — E782 Mixed hyperlipidemia: Secondary | ICD-10-CM | POA: Diagnosis not present

## 2023-08-15 DIAGNOSIS — N1832 Chronic kidney disease, stage 3b: Secondary | ICD-10-CM | POA: Diagnosis not present

## 2023-08-15 DIAGNOSIS — D631 Anemia in chronic kidney disease: Secondary | ICD-10-CM | POA: Diagnosis not present

## 2023-08-15 DIAGNOSIS — I129 Hypertensive chronic kidney disease with stage 1 through stage 4 chronic kidney disease, or unspecified chronic kidney disease: Secondary | ICD-10-CM | POA: Diagnosis not present

## 2023-08-15 DIAGNOSIS — R8290 Unspecified abnormal findings in urine: Secondary | ICD-10-CM | POA: Diagnosis not present

## 2023-09-27 DIAGNOSIS — Z1321 Encounter for screening for nutritional disorder: Secondary | ICD-10-CM | POA: Diagnosis not present

## 2023-09-27 DIAGNOSIS — Z8639 Personal history of other endocrine, nutritional and metabolic disease: Secondary | ICD-10-CM | POA: Diagnosis not present

## 2023-09-27 DIAGNOSIS — N1832 Chronic kidney disease, stage 3b: Secondary | ICD-10-CM | POA: Diagnosis not present

## 2023-09-27 DIAGNOSIS — R790 Abnormal level of blood mineral: Secondary | ICD-10-CM | POA: Diagnosis not present

## 2023-09-27 DIAGNOSIS — I129 Hypertensive chronic kidney disease with stage 1 through stage 4 chronic kidney disease, or unspecified chronic kidney disease: Secondary | ICD-10-CM | POA: Diagnosis not present

## 2023-09-27 DIAGNOSIS — Z1331 Encounter for screening for depression: Secondary | ICD-10-CM | POA: Diagnosis not present

## 2023-11-14 DIAGNOSIS — M79645 Pain in left finger(s): Secondary | ICD-10-CM | POA: Diagnosis not present

## 2024-03-09 ENCOUNTER — Emergency Department

## 2024-03-09 ENCOUNTER — Encounter: Payer: Self-pay | Admitting: Emergency Medicine

## 2024-03-09 ENCOUNTER — Emergency Department
Admission: EM | Admit: 2024-03-09 | Discharge: 2024-03-13 | Disposition: A | Discharge status: Skilled Nursing Facility | Attending: Emergency Medicine | Admitting: Emergency Medicine

## 2024-03-09 ENCOUNTER — Other Ambulatory Visit: Payer: Self-pay

## 2024-03-09 DIAGNOSIS — Y92009 Unspecified place in unspecified non-institutional (private) residence as the place of occurrence of the external cause: Secondary | ICD-10-CM | POA: Insufficient documentation

## 2024-03-09 DIAGNOSIS — M79601 Pain in right arm: Secondary | ICD-10-CM | POA: Insufficient documentation

## 2024-03-09 DIAGNOSIS — I10 Essential (primary) hypertension: Secondary | ICD-10-CM | POA: Diagnosis not present

## 2024-03-09 DIAGNOSIS — M25562 Pain in left knee: Secondary | ICD-10-CM | POA: Diagnosis present

## 2024-03-09 DIAGNOSIS — W1830XA Fall on same level, unspecified, initial encounter: Secondary | ICD-10-CM | POA: Insufficient documentation

## 2024-03-09 DIAGNOSIS — F0390 Unspecified dementia without behavioral disturbance: Secondary | ICD-10-CM | POA: Insufficient documentation

## 2024-03-09 DIAGNOSIS — M25552 Pain in left hip: Secondary | ICD-10-CM | POA: Insufficient documentation

## 2024-03-09 LAB — COMPREHENSIVE METABOLIC PANEL WITH GFR
ALT: 25 U/L (ref 0–44)
AST: 23 U/L (ref 15–41)
Albumin: 4.1 g/dL (ref 3.5–5.0)
Alkaline Phosphatase: 83 U/L (ref 38–126)
Anion gap: 14 (ref 5–15)
BUN: 24 mg/dL — ABNORMAL HIGH (ref 8–23)
CO2: 18 mmol/L — ABNORMAL LOW (ref 22–32)
Calcium: 9.7 mg/dL (ref 8.9–10.3)
Chloride: 110 mmol/L (ref 98–111)
Creatinine, Ser: 1.39 mg/dL — ABNORMAL HIGH (ref 0.44–1.00)
GFR, Estimated: 36 mL/min — ABNORMAL LOW (ref >60)
Glucose, Bld: 98 mg/dL (ref 70–99)
Potassium: 4.3 mmol/L (ref 3.5–5.1)
Sodium: 141 mmol/L (ref 135–145)
Total Bilirubin: 0.4 mg/dL (ref 0.0–1.2)
Total Protein: 7 g/dL (ref 6.5–8.1)

## 2024-03-09 LAB — RESP PANEL BY RT-PCR (RSV, FLU A&B, COVID)  RVPGX2
Influenza A by PCR: NEGATIVE
Influenza B by PCR: NEGATIVE
Resp Syncytial Virus by PCR: NEGATIVE
SARS Coronavirus 2 by RT PCR: NEGATIVE

## 2024-03-09 LAB — CBC
HCT: 37.2 % (ref 36.0–46.0)
Hemoglobin: 11.6 g/dL — ABNORMAL LOW (ref 12.0–15.0)
MCH: 30.5 pg (ref 26.0–34.0)
MCHC: 31.2 g/dL (ref 30.0–36.0)
MCV: 97.9 fL (ref 80.0–100.0)
Platelets: 262 K/uL (ref 150–400)
RBC: 3.8 MIL/uL — ABNORMAL LOW (ref 3.87–5.11)
RDW: 13.2 % (ref 11.5–15.5)
WBC: 9.1 K/uL (ref 4.0–10.5)
nRBC: 0 % (ref 0.0–0.2)

## 2024-03-09 LAB — URINALYSIS, W/ REFLEX TO CULTURE (INFECTION SUSPECTED)
Bilirubin Urine: NEGATIVE
Glucose, UA: NEGATIVE mg/dL
Hgb urine dipstick: NEGATIVE
Ketones, ur: NEGATIVE mg/dL
Leukocytes,Ua: NEGATIVE
Nitrite: NEGATIVE
Protein, ur: NEGATIVE mg/dL
Specific Gravity, Urine: 1.006 (ref 1.005–1.030)
pH: 5 (ref 5.0–8.0)

## 2024-03-09 LAB — TROPONIN T, HIGH SENSITIVITY
Troponin T High Sensitivity: 22 ng/L — ABNORMAL HIGH (ref 0–19)
Troponin T High Sensitivity: 21 ng/L — ABNORMAL HIGH (ref 0–19)

## 2024-03-09 LAB — PHOSPHORUS: Phosphorus: 3.1 mg/dL (ref 2.5–4.6)

## 2024-03-09 LAB — MAGNESIUM: Magnesium: 1.8 mg/dL (ref 1.7–2.4)

## 2024-03-09 MED ORDER — LISINOPRIL 10 MG PO TABS
40.0000 mg | ORAL_TABLET | Freq: Every day | ORAL | Status: DC
  Administered 2024-03-11 – 2024-03-13 (×3): 40 mg via ORAL
  Filled 2024-03-09 (×3): qty 4

## 2024-03-09 MED ORDER — ACETAMINOPHEN 325 MG PO TABS
650.0000 mg | ORAL_TABLET | Freq: Once | ORAL | Status: AC
  Administered 2024-03-09: 650 mg via ORAL
  Filled 2024-03-09: qty 2

## 2024-03-09 MED ORDER — ACETAMINOPHEN 325 MG PO TABS: 650.0000 mg | ORAL_TABLET | Freq: Three times a day (TID) | ORAL | Status: DC | PRN

## 2024-03-09 MED ORDER — CLOPIDOGREL BISULFATE 75 MG PO TABS
75.0000 mg | ORAL_TABLET | Freq: Every day | ORAL | Status: DC
  Administered 2024-03-11 – 2024-03-13 (×3): 75 mg via ORAL
  Filled 2024-03-09 (×3): qty 1

## 2024-03-09 MED ORDER — CHOLESTYRAMINE 4 G PO PACK
4.0000 g | PACK | Freq: Every day | ORAL | Status: DC
  Administered 2024-03-10 – 2024-03-12 (×3): 4 g via ORAL
  Filled 2024-03-09 (×4): qty 1

## 2024-03-09 MED ORDER — ACETAMINOPHEN 325 MG PO TABS
650.0000 mg | ORAL_TABLET | ORAL | Status: DC | PRN
  Administered 2024-03-10 – 2024-03-12 (×4): 650 mg via ORAL
  Filled 2024-03-09 (×4): qty 2

## 2024-03-09 MED ORDER — LACTATED RINGERS IV BOLUS
1000.0000 mL | Freq: Once | INTRAVENOUS | Status: AC
  Administered 2024-03-09: 1000 mL via INTRAVENOUS

## 2024-03-09 MED ORDER — LORATADINE 10 MG PO TABS
10.0000 mg | ORAL_TABLET | Freq: Every day | ORAL | Status: DC
  Administered 2024-03-11 – 2024-03-13 (×3): 10 mg via ORAL
  Filled 2024-03-09 (×3): qty 1

## 2024-03-09 MED ORDER — PANTOPRAZOLE SODIUM 20 MG PO TBEC
20.0000 mg | DELAYED_RELEASE_TABLET | Freq: Two times a day (BID) | ORAL | Status: DC
  Administered 2024-03-10 – 2024-03-12 (×5): 20 mg via ORAL
  Filled 2024-03-09 (×7): qty 1

## 2024-03-09 MED ORDER — AMLODIPINE BESYLATE 5 MG PO TABS
10.0000 mg | ORAL_TABLET | Freq: Every day | ORAL | Status: DC
  Administered 2024-03-11 – 2024-03-13 (×3): 10 mg via ORAL
  Filled 2024-03-09 (×3): qty 2

## 2024-03-09 NOTE — ED Provider Notes (Signed)
 [facility] Provider Note    Event Date/Time   First MD Initiated Contact with Patient 03/09/24 1702     (approximate)   History   Chief Complaint: Fall   HPI  Sharon Barajas is a 89 y.o. female with a history of hypertension, dementia who is brought to the ED due to a fall at home.  She has been living with her sister who is 54 years old, but has had functional decline over the past month.  Patient frequently is confused, often forgets to use an assistive device or wait for help from her sister and has become increasingly at risk for fall and injury.  Today she fell, and she reports pain in the left knee.  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   Hypercholesteremia    Hypertension    Macular degeneration     Current Outpatient Rx   Order #: [id number] Class: Historical Med   Order #: [id number] Class: OTC   Order #: [id number] Class: Historical Med   Order #: [id number] Class: Historical Med   Order #: [id number] Class: Historical Med   Order #: [id number] Class: Normal   Order #: [id number] Class: OTC   Order #: [id number] Class: OTC   Order #: [id number] Class: OTC   Order #: [id number] Class: Historical Med   Order #: [id number] Class: Historical Med   Order #: [id number] Class: Historical Med   Order #: [id number] Class: OTC    Past Surgical History:  Procedure Laterality Date   ABDOMINAL HYSTERECTOMY     CHOLECYSTECTOMY     COLON SURGERY     COLONOSCOPY WITH PROPOFOL  N/A 02/23/2016   Procedure: COLONOSCOPY WITH PROPOFOL ;  Surgeon: Gladis RAYMOND Mariner, MD;  Location: [facility] ENDOSCOPY;  Service: Endoscopy;  Laterality: N/A;   ENDARTERECTOMY Left 12/17/2014   Procedure: ENDARTERECTOMY CAROTID;  Surgeon: Selinda GORMAN Gu, MD;  Location: [facility] ORS;  Service: Vascular;   Laterality: Left;   INNER EAR SURGERY Left    Removal of ear tumor.    Physical Exam   Triage Vital Signs: ED Triage Vitals  Encounter Vitals Group     BP 03/09/24 1627 (!) 180/60     Girls Systolic BP Percentile --      Girls Diastolic BP Percentile --      Boys Systolic BP Percentile --      Boys Diastolic BP Percentile --      Pulse Rate 03/09/24 1627 88     Resp 03/09/24 1627 18     Temp 03/09/24 1627 97.6 F (36.4 C)     Temp src --      SpO2 03/09/24 1627 96 %     Weight 03/09/24 1631 89 lb (40.4 kg)     Height 03/09/24 1631 4' 10 (1.473 m)     Head Circumference --      Peak Flow --      Pain Score --      Pain Loc --      Pain Education --      Exclude from Growth Chart --     Most recent vital signs: Vitals:   03/09/24 2030 03/09/24 2102  BP: (!) 149/66 (!) 157/62  Pulse: 81 83  Resp:  20  Temp:  (!) 97.3  F (36.3 C)  SpO2: 94% 94%    General: Awake, no distress.  CV:  Good peripheral perfusion.  Regular rate rhythm Resp:  Normal effort.  Clear lungs Abd:  No distention.  Soft nontender Other:    There is tenderness at the left knee and left hip.  No limb shortening or obvious deformity.  No laceration.   ED Results / Procedures / Treatments   Labs (all labs ordered are listed, but only abnormal results are displayed) Labs Reviewed  CBC - Abnormal; Notable for the following components:      Result Value   RBC 3.80 (*)    Hemoglobin 11.6 (*)    All other components within normal limits  COMPREHENSIVE METABOLIC PANEL WITH GFR - Abnormal; Notable for the following components:   CO2 18 (*)    BUN 24 (*)    Creatinine, Ser 1.39 (*)    GFR, Estimated 36 (*)    All other components within normal limits  URINALYSIS, W/ REFLEX TO CULTURE (INFECTION SUSPECTED) - Abnormal; Notable for the following components:   Color, Urine STRAW (*)    APPearance CLEAR (*)    Bacteria, UA RARE (*)    All other components within normal limits  TROPONIN T, HIGH  SENSITIVITY - Abnormal; Notable for the following components:   Troponin T High Sensitivity 22 (*)    All other components within normal limits  TROPONIN T, HIGH SENSITIVITY - Abnormal; Notable for the following components:   Troponin T High Sensitivity 21 (*)    All other components within normal limits  RESP PANEL BY RT-PCR (RSV, FLU A&B, COVID)  RVPGX2  MAGNESIUM   PHOSPHORUS     EKG Interpreted by me Normal sinus rhythm rate of 91.  Normal axis and intervals.  Normal QRS ST segments and T waves   RADIOLOGY X-ray left hip interpreted by me, negative for fracture.  Radiology report reviewed  X-ray left knee unremarkable   PROCEDURES:  Procedures   MEDICATIONS ORDERED IN ED: Medications  acetaminophen  (TYLENOL ) tablet 650 mg (has no administration in time range)  acetaminophen  (TYLENOL ) tablet 650 mg (has no administration in time range)  cholestyramine  (QUESTRAN ) packet 4 g (has no administration in time range)  pantoprazole  (PROTONIX ) EC tablet 20 mg (has no administration in time range)  clopidogrel  (PLAVIX ) tablet 75 mg (has no administration in time range)  loratadine  (CLARITIN ) tablet 10 mg (has no administration in time range)  lisinopril  (ZESTRIL ) tablet 40 mg (has no administration in time range)  amLODipine  (NORVASC ) tablet 10 mg (has no administration in time range)  lactated ringers  bolus 1,000 mL (0 mLs Intravenous Stopped 03/09/24 2041)  acetaminophen  (TYLENOL ) tablet 650 mg (650 mg Oral Given 03/09/24 1806)     IMPRESSION / MDM / ASSESSMENT AND PLAN / ED COURSE  I reviewed the triage vital signs and the nursing notes.  DDx: Electrolyte derangement, anemia, knee fracture, hip fracture, UTI, COVID, influenza, non-STEMI  Patient's presentation is most consistent with acute presentation with potential threat to life or bodily function.  Patient presents with worsening confusion, fall at home.  Has some musculoskeletal pain without obvious injury.  Will  obtain labs and x-rays.  Family notes that patient is no longer able to be cared for safely at home with her available family support consisting of her 75 year old sister.   Clinical Course as of 03/09/24 2234  Sat Mar 09, 2024  2105 Workup unremarkable. Medically stable. Will consult TOC + PT [PS]  Clinical Course User Index [PS] Viviann Pastor, MD     FINAL CLINICAL IMPRESSION(S) / ED DIAGNOSES   Final diagnoses:  Fall in home, initial encounter  Chronic dementia [facility])     Rx / DC Orders   ED Discharge Orders     None        Note:  This document was prepared using Dragon voice recognition software and may include unintentional dictation errors.   Viviann Pastor, MD 03/09/24 2234

## 2024-03-09 NOTE — ED Notes (Signed)
 2nd trop sent.

## 2024-03-09 NOTE — ED Triage Notes (Addendum)
 Pt to ER with granddaughter states pt stood up from couch and fell to floor.  Did not hit head.  Pt is unsteady on her feet and is not ambulatory on her own.  Granddaughter reports decline in last month and pt is not currently able to answer orientation questions, not acute today. Endorses pain to right arm and left knee.  Family states it has become increasingly hard to care for pt at home.

## 2024-03-10 NOTE — ED Provider Notes (Signed)
 Emergency Medicine Observation Re-evaluation Note   BP (!) 157/62 (BP Location: Left Arm)   Pulse 83   Temp (!) 97.3 F (36.3 C) (Oral)   Resp 20   Ht 4' 10 (1.473 m)   Wt 40.4 kg   SpO2 94%   BMI 18.60 kg/m    ED Course / MDM   No reported events during my shift at the time of this note.   Pt is awaiting dispo from consultants   Ginnie Shams MD    Shams Ginnie, MD 03/10/24 [phone]

## 2024-03-10 NOTE — ED Notes (Signed)
 Granddaughter Loria went home for the night. Info is in chart if we need to contact her.

## 2024-03-10 NOTE — TOC Initial Note (Signed)
 Transition of Care [facility]) - Initial/Assessment Note    Patient Details  Name: Sharon Barajas MRN: [id number] Date of Birth: 06/20/1931  Transition of Care [facility] CM/SW Contact:    Armour Villanueva L Kirstine Jacquin, LCSW Phone Number: 03/10/2024, 3:00 PM  Clinical Narrative:                  CSW advised that PT is recommending SNF. CSW met with patient and her two granddaughters. Patient is hard of hearing. CSW spoke with [place]. Mandi advised that patient is being cared for by her two sisters. She alternates between residences and they assist with care giving. Mandi advised that family is unable to care for patient and they are looking for LTC placement. CSW provided resources to assist the family in locating LTC beds. CSW advised that a STR placement can be found if patient meets the criteria and once placed, family can have that discussion regarding LTC beds.   Mandi was provided resources for care patrol and A place for Mom to assist with locating LTC beds. Mandi advised of no preference for beds and advised that any bed offer received they will take. She stated that she is the HCPOA and she has access to her grandmothers accounts. She stated that her grandmother signed her house over to her only son which is Mandi's dad. She stated that when he passed away, patient advised them that she put the house in their name.   Mandi advised that they have no other options. Patient receives $1300 a month. CSW offered to have the financial navigator at the hospital speak to family about LTC medicaid. Mandi agreed.   Secure chat sent to financial navigator.          Patient Goals and CMS Choice            Expected Discharge Plan and Services                                              Prior Living Arrangements/Services                       Activities of Daily Living      Permission Sought/Granted                  Emotional Assessment              Admission  diagnosis:  Fall Patient Active Problem List   Diagnosis Date Noted   Protein-calorie malnutrition, severe 02/20/2023   Acute hypoxemic respiratory failure (HCC) 12/13/2022   Acute on chronic diastolic (congestive) heart failure (HCC) 12/13/2022   GAD (generalized anxiety disorder) 08/18/2020   Gastroesophageal reflux disease without esophagitis 09/13/2019   History of vitamin D  deficiency 09/13/2019   Other osteoporosis without current pathological fracture 09/13/2019   Senile purpura 08/06/2018   Community acquired pneumonia 04/29/2018   Acute kidney injury superimposed on CKD 04/29/2018   Elevated troponin 04/29/2018   Pneumonia 04/29/2018   Essential hypertension 07/26/2017   Hyperlipidemia 07/26/2017   Encounter for general adult medical examination without abnormal findings 09/27/2016   Seasonal allergic rhinitis due to pollen 09/27/2016   Moderate mitral insufficiency 01/07/2016   Vaccine counseling 11/23/2015   Carotid stenosis 12/17/2014   Bilateral carotid artery stenosis 10/17/2014   H/O adenomatous polyp of colon 10/22/2013   PCP:  Salli Amato, MD  Pharmacy:   [facility] - [place], [place] - [address] [place] [address] Phone: [phone] Fax: [phone]     Social Drivers of Health (SDOH) Social History: SDOH Screenings   Food Insecurity: No Food Insecurity (09/27/2023)   Received from [facility]  Housing: Low Risk  (09/27/2023)   Received from [facility]  Transportation Needs: No Transportation Needs (09/27/2023)   Received from [facility]  Utilities: Not At Risk (09/27/2023)   Received from [facility]  Financial Resource Strain: Low Risk  (09/27/2023)   Received from [facility]  Tobacco Use: Low Risk  (03/09/2024)   SDOH Interventions:     Readmission Risk Interventions     No data to display

## 2024-03-10 NOTE — Evaluation (Signed)
 Physical Therapy Evaluation Patient Details Name: Sharon Barajas MRN: [id number] DOB: 1932-01-15 Today's Date: 03/10/2024  History of Present Illness  Sharon Barajas is a 89 y.o. female with a history of hypertension, dementia who is brought to the ED due to a fall at home.  She has been living with her sister who is 27 years old, but has had functional decline over the past month.  Patient frequently is confused, often forgets to use an assistive device or wait for help from her sister and has become increasingly at risk for fall and injury.  Today she fell, and she reports pain in the left knee.  No head injury  Clinical Impression  Patient noted to be in supine position at PT arrival in room, for an initial PT evaluation due to a decline in functional status, with baseline mobility reported as needing assistance for ADLs but is modI for mobility with RW with hx of falls, and currently requiring modA for transfer and ambulation with RW. The patient is A&O x 1, presenting with good willingness to work with PT. The patient resides in a house and lives with family/friend support. Gait was assessed with RW, limited by LE buckling and generalized weakness L>R. The overall clinical impression is that the patient presents with moderate mobility limitations secondary to acute medical issues. Recommended skilled PT will address safety, mobility, and discharge planning. PT recommendation to d/c patient to SNF upon medical clearance.        If plan is discharge home, recommend the following: A lot of help with walking and/or transfers;A lot of help with bathing/dressing/bathroom;Help with stairs or ramp for entrance;Direct supervision/assist for medications management;Assistance with feeding;Assist for transportation;Direct supervision/assist for financial management;Supervision due to cognitive status;Assistance with cooking/housework   Can travel by private vehicle   No    Equipment  Recommendations Other (comment) (TBD)  Recommendations for Other Services       Functional Status Assessment Patient has had a recent decline in their functional status and/or demonstrates limited ability to make significant improvements in function in a reasonable and predictable amount of time     Precautions / Restrictions Precautions Precautions: Fall Restrictions Weight Bearing Restrictions Per Provider Order: No      Mobility  Bed Mobility Overal bed mobility: Needs Assistance Bed Mobility: Supine to Sit     Supine to sit: Mod assist, +2 for physical assistance     General bed mobility comments: vc needed step by step; decreased movement speed    Transfers Overall transfer level: Needs assistance Equipment used: Rolling walker (2 wheels) Transfers: Sit to/from Stand Sit to Stand: Mod assist           General transfer comment: attempt x2; able to stable with second attempt with RW; vc for movement sequence needing LLE knee block to prevent buckle    Ambulation/Gait Ambulation/Gait assistance: Mod assist, Min assist Gait Distance (Feet): 30 Feet Assistive device: Rolling walker (2 wheels) Gait Pattern/deviations: Step-to pattern, Step-through pattern, Drifts right/left, Shuffle, Knees buckling, Trunk flexed, Narrow base of support, Decreased stride length Gait velocity: decreased     General Gait Details: increased unsteadiness with RW, poor RW management bilateral LE weakness noted L>R  Stairs            Wheelchair Mobility     Tilt Bed    Modified Rankin (Stroke Patients Only)       Balance Overall balance assessment: Needs assistance Sitting-balance support: Feet supported Sitting balance-Leahy Scale: Fair  Postural control: Posterior lean Standing balance support: During functional activity, Reliant on assistive device for balance Standing balance-Leahy Scale: Poor Standing balance comment: requires balance from RW to prevent fall; Pt  not able to stand for prolonged period; pt unsteady during static standing                             Pertinent Vitals/Pain Pain Assessment Pain Assessment: No/denies pain    Home Living Family/patient expects to be discharged to:: Private residence Living Arrangements: Other relatives Available Help at Discharge: Family;Available 24 hours/day Type of Home: House Home Access: Stairs to enter   [employer] of Steps: 1+1   Home Layout: One level Home Equipment: [employer] (2 wheels);Toilet riser;Shower seat Additional Comments: recent fall    Prior Function Prior Level of Function : Needs assist  Cognitive Assist : Mobility (cognitive) Mobility (Cognitive): Step by step cues   Physical Assist : Mobility (physical) Mobility (physical): Bed mobility;Transfers;Gait;Stairs   Mobility Comments: RW home needs constant 24/7 care givers experiencing burnout looking for SNF in build strength and plan to transition into LTC ADLs Comments: assist for dressing/bathijng and iADLS from family     Extremity/Trunk Assessment        Lower Extremity Assessment Lower Extremity Assessment: Generalized weakness;LLE deficits/detail    Cervical / Trunk Assessment Cervical / Trunk Assessment: Normal  Communication   Communication Communication: No apparent difficulties Factors Affecting Communication: Hearing impaired;Difficulty expressing self    Cognition Arousal: Alert, Lethargic Behavior During Therapy: Flat affect   PT - Cognitive impairments: History of cognitive impairments, Orientation, Awareness, Memory, Initiation, Sequencing, Safety/Judgement   Orientation impairments: Place, Time, Situation                     Following commands: Impaired Following commands impaired: Follows one step commands inconsistently     Cueing Cueing Techniques: Verbal cues, Gestural cues, Tactile cues, Visual cues     General Comments      Exercises      Assessment/Plan    PT Assessment Patient needs continued PT services  PT Problem List Decreased strength;Decreased activity tolerance;Decreased mobility       PT Treatment Interventions Stair training;Functional mobility training;Therapeutic activities;Therapeutic exercise;Balance training;Neuromuscular re-education;Patient/family education    PT Goals (Current goals can be found in the Care Plan section)  Acute Rehab PT Goals Patient Stated Goal: Pt family in room wants pt. to go to SNF PT Goal Formulation: With family Time For Goal Achievement: 03/31/24 Potential to Achieve Goals: Good    Frequency Min 2X/week     Co-evaluation               AM-PAC PT 6 Clicks Mobility  Outcome Measure Help needed turning from your back to your side while in a flat bed without using bedrails?: A Little Help needed moving from lying on your back to sitting on the side of a flat bed without using bedrails?: A Little Help needed moving to and from a bed to a chair (including a wheelchair)?: A Little Help needed standing up from a chair using your arms (e.g., wheelchair or bedside chair)?: A Lot Help needed to walk in hospital room?: A Lot Help needed climbing 3-5 steps with a railing? : Total 6 Click Score: 14    End of Session Equipment Utilized During Treatment: Gait belt Activity Tolerance: Patient limited by fatigue Patient left: in bed;with family/visitor present;with call bell/phone within reach Nurse  Communication: Mobility status PT Visit Diagnosis: Other abnormalities of gait and mobility (R26.89);Muscle weakness (generalized) (M62.81);Difficulty in walking, not elsewhere classified (R26.2);Unsteadiness on feet (R26.81)    Time: 8640-8581 PT Time Calculation (min) (ACUTE ONLY): 19 min   Charges:   PT Evaluation $PT Eval Low Complexity: 1 Low   PT General Charges $$ ACUTE PT VISIT: 1 Visit        Sherlean Lesches DPT, PT    Sherlean A Jorryn Casagrande 03/10/2024, 2:32  PM

## 2024-03-10 NOTE — NC FL2 (Signed)
 [place]  MEDICAID [id number] LEVEL OF CARE FORM     IDENTIFICATION  Patient Name: Sharon  FIANA Barajas Birthdate: [birth year] Sex: female Admission Date (Current Location): 03/09/2024  [facility] and [place] Number:  Chiropodist and Address:  [facility], [address] [place], [place] [address]      Provider Number: [id number]  Attending Physician Name and Address:  Waymond Lorelle Cummins, MD  Relative Name and Phone Number:  Sheyla, Zaffino (Granddaughter)  [phone] [facility] Phone)    Current Level of Care: Hospital Recommended Level of Care: Skilled Nursing Facility Prior Approval Number:    Date Approved/Denied: 03/10/24 PASRR Number: [id number]  Discharge Plan: SNF    Current Diagnoses: Patient Active Problem List   Diagnosis Date Noted   Protein-calorie malnutrition, severe 02/20/2023   Acute hypoxemic respiratory failure (HCC) 12/13/2022   Acute on chronic diastolic (congestive) heart failure (HCC) 12/13/2022   GAD (generalized anxiety disorder) 08/18/2020   Gastroesophageal reflux disease without esophagitis 09/13/2019   History of vitamin D  deficiency 09/13/2019   Other osteoporosis without current pathological fracture 09/13/2019   Senile purpura 08/06/2018   Community acquired pneumonia 04/29/2018   Acute kidney injury superimposed on CKD 04/29/2018   Elevated troponin 04/29/2018   Pneumonia 04/29/2018   Essential hypertension 07/26/2017   Hyperlipidemia 07/26/2017   Encounter for general adult medical examination without abnormal findings 09/27/2016   Seasonal allergic rhinitis due to pollen 09/27/2016   Moderate mitral insufficiency 01/07/2016   Vaccine counseling 11/23/2015   Carotid stenosis 12/17/2014   Bilateral carotid artery stenosis 10/17/2014   H/O adenomatous polyp of colon 10/22/2013    Orientation RESPIRATION BLADDER Height & Weight     Self  Normal Continent Weight: 89 lb (40.4 kg) Height:  4' 10 (147.3 cm)   BEHAVIORAL SYMPTOMS/MOOD NEUROLOGICAL BOWEL NUTRITION STATUS     (Dementia) Continent Diet  AMBULATORY STATUS COMMUNICATION OF NEEDS Skin   Extensive Assist Verbally Normal                       Personal Care Assistance Level of Assistance  Bathing, Feeding, Dressing Bathing Assistance: Maximum assistance Feeding assistance: Limited assistance Dressing Assistance: Maximum assistance     Functional Limitations Info  Hearing   Hearing Info: Impaired      SPECIAL CARE FACTORS FREQUENCY  PT (By licensed PT)     PT Frequency: 2x              Contractures Contractures Info: Not present    Additional Factors Info  Allergies   Allergies Info: NKA           Current Medications (03/10/2024):  This is the current hospital active medication list Current Facility-Administered Medications  Medication Dose Route Frequency Provider Last Rate Last Admin   acetaminophen  (TYLENOL ) tablet 650 mg  650 mg Oral Q4H PRN Viviann Pastor, MD       amLODipine  (NORVASC ) tablet 10 mg  10 mg Oral Daily Viviann Pastor, MD       cholestyramine  (QUESTRAN ) packet 4 g  4 g Oral Daily Viviann Pastor, MD   4 g at 03/10/24 1005   clopidogrel  (PLAVIX ) tablet 75 mg  75 mg Oral Daily Viviann Pastor, MD       lisinopril  (ZESTRIL ) tablet 40 mg  40 mg Oral Daily Viviann Pastor, MD       loratadine  (CLARITIN ) tablet 10 mg  10 mg Oral Daily Viviann Pastor, MD       pantoprazole  (PROTONIX ) EC  tablet 20 mg  20 mg Oral BID Viviann Pastor, MD       Current Outpatient Medications  Medication Sig Dispense Refill   acetaminophen  (TYLENOL ) 325 MG tablet Take 650 mg by mouth every 8 (eight) hours as needed for mild pain.     acidophilus (RISAQUAD) CAPS capsule Take 2 capsules by mouth daily.     amLODipine  (NORVASC ) 10 MG tablet Take 10 mg by mouth daily.     Calcium  Carbonate-Vitamin D  600-400 MG-UNIT tablet Take 1 tablet by mouth 2 (two) times daily with a meal.     cetirizine (ZYRTEC)  10 MG tablet Take 10 mg by mouth daily.     cholestyramine  (QUESTRAN ) 4 g packet Take 1 packet (4 g total) by mouth 2 (two) times daily. 60 each 0   clopidogrel  (PLAVIX ) 75 MG tablet Take 75 mg by mouth daily.     feeding supplement (ENSURE ENLIVE / ENSURE PLUS) LIQD Take 237 mLs by mouth 2 (two) times daily between meals.     lisinopril  (ZESTRIL ) 40 MG tablet Take 40 mg by mouth daily.     liver oil-zinc  oxide (DESITIN) 40 % ointment Apply topically as needed for irritation (rash).     loperamide  (IMODIUM ) 2 MG capsule Take 1 capsule (2 mg total) by mouth as needed for diarrhea or loose stools.     Magnesium  Oxide -Mg Supplement 400 MG CAPS Take 400 mg by mouth daily.     Multiple Vitamins-Minerals (ICAPS) CAPS Take 1 capsule by mouth 2 (two) times daily.     nitroGLYCERIN  (NITROSTAT ) 0.4 MG SL tablet Place 0.4 mg under the tongue every 5 (five) minutes as needed for chest pain.     pantoprazole  (PROTONIX ) 20 MG tablet Take 20 mg by mouth daily.     psyllium (HYDROCIL/METAMUCIL) 95 % PACK Take 1 packet by mouth daily.       Discharge Medications: Please see discharge summary for a list of discharge medications.  Relevant Imaging Results:  Relevant Lab Results:   Additional Information [id number]  Sharon Bradwell L Lasharn Bufkin, LCSW

## 2024-03-11 NOTE — NC FL2 (Addendum)
 [place]  MEDICAID FL2 LEVEL OF CARE FORM     IDENTIFICATION  Patient Name: Sharon  C Barajas Birthdate: 09/05/1931 Sex: female Admission Date (Current Location): 03/09/2024  [facility] and [place] Number:  Chiropodist and Address:  [facility], [address] [place], [place] [address]      Provider Number: [id number]  Attending Physician Name and Address:  Ernest Ronal BRAVO, MD  Relative Name and Phone Number:  Wilmarie, Sparlin (Granddaughter)  [phone] [facility] Phone)    Current Level of Care: Hospital Recommended Level of Care: Skilled Nursing Facility Prior Approval Number:    Date Approved/Denied: 03/10/24 PASRR Number: [id number]  Discharge Plan: SNF    Current Diagnoses: Patient Active Problem List   Diagnosis Date Noted   Protein-calorie malnutrition, severe 02/20/2023   Acute hypoxemic respiratory failure (HCC) 12/13/2022   Acute on chronic diastolic (congestive) heart failure (HCC) 12/13/2022   GAD (generalized anxiety disorder) 08/18/2020   Gastroesophageal reflux disease without esophagitis 09/13/2019   History of vitamin D  deficiency 09/13/2019   Other osteoporosis without current pathological fracture 09/13/2019   Senile purpura 08/06/2018   Community acquired pneumonia 04/29/2018   Acute kidney injury superimposed on CKD 04/29/2018   Elevated troponin 04/29/2018   Pneumonia 04/29/2018   Essential hypertension 07/26/2017   Hyperlipidemia 07/26/2017   Encounter for general adult medical examination without abnormal findings 09/27/2016   Seasonal allergic rhinitis due to pollen 09/27/2016   Moderate mitral insufficiency 01/07/2016   Vaccine counseling 11/23/2015   Carotid stenosis 12/17/2014   Bilateral carotid artery stenosis 10/17/2014   H/O adenomatous polyp of colon 10/22/2013    Orientation RESPIRATION BLADDER Height & Weight     Self  Normal Continent Weight: 40.4 kg Height:  4' 10 (147.3 cm)  BEHAVIORAL  SYMPTOMS/MOOD NEUROLOGICAL BOWEL NUTRITION STATUS     (Dementia) Continent Diet  AMBULATORY STATUS COMMUNICATION OF NEEDS Skin   Extensive Assist Verbally Normal                       Personal Care Assistance Level of Assistance  Bathing, Feeding, Dressing Bathing Assistance: Maximum assistance Feeding assistance: Limited assistance Dressing Assistance: Maximum assistance     Functional Limitations Info  Hearing   Hearing Info: Impaired      SPECIAL CARE FACTORS FREQUENCY  PT (By licensed PT)     PT Frequency: 2x              Contractures Contractures Info: Not present    Additional Factors Info  Allergies   Allergies Info: NKA           Current Medications (03/11/2024):  This is the current hospital active medication list Current Facility-Administered Medications  Medication Dose Route Frequency Provider Last Rate Last Admin   acetaminophen  (TYLENOL ) tablet 650 mg  650 mg Oral Q4H PRN Viviann Pastor, MD   650 mg at 03/10/24 2120   amLODipine  (NORVASC ) tablet 10 mg  10 mg Oral Daily Viviann Pastor, MD   10 mg at 03/11/24 9063   cholestyramine  (QUESTRAN ) packet 4 g  4 g Oral Daily Viviann Pastor, MD   4 g at 03/11/24 0935   clopidogrel  (PLAVIX ) tablet 75 mg  75 mg Oral Daily Viviann Pastor, MD   75 mg at 03/11/24 9063   lisinopril  (ZESTRIL ) tablet 40 mg  40 mg Oral Daily Viviann Pastor, MD   40 mg at 03/11/24 9063   loratadine  (CLARITIN ) tablet 10 mg  10 mg Oral Daily Viviann Pastor,  MD   10 mg at 03/11/24 9062   pantoprazole  (PROTONIX ) EC tablet 20 mg  20 mg Oral BID Viviann Pastor, MD   20 mg at 03/11/24 9063   Current Outpatient Medications  Medication Sig Dispense Refill   acetaminophen  (TYLENOL ) 325 MG tablet Take 650 mg by mouth every 8 (eight) hours as needed for mild pain.     acidophilus (RISAQUAD) CAPS capsule Take 2 capsules by mouth daily.     amLODipine  (NORVASC ) 10 MG tablet Take 10 mg by mouth daily.     Calcium   Carbonate-Vitamin D  600-400 MG-UNIT tablet Take 1 tablet by mouth 2 (two) times daily with a meal.     cetirizine (ZYRTEC) 10 MG tablet Take 10 mg by mouth daily.     cholestyramine  (QUESTRAN ) 4 g packet Take 1 packet (4 g total) by mouth 2 (two) times daily. 60 each 0   clopidogrel  (PLAVIX ) 75 MG tablet Take 75 mg by mouth daily.     feeding supplement (ENSURE ENLIVE / ENSURE PLUS) LIQD Take 237 mLs by mouth 2 (two) times daily between meals.     lisinopril  (ZESTRIL ) 40 MG tablet Take 40 mg by mouth daily.     liver oil-zinc  oxide (DESITIN) 40 % ointment Apply topically as needed for irritation (rash).     loperamide  (IMODIUM ) 2 MG capsule Take 1 capsule (2 mg total) by mouth as needed for diarrhea or loose stools.     Magnesium  Oxide -Mg Supplement 400 MG CAPS Take 400 mg by mouth daily.     Multiple Vitamins-Minerals (ICAPS) CAPS Take 1 capsule by mouth 2 (two) times daily.     nitroGLYCERIN  (NITROSTAT ) 0.4 MG SL tablet Place 0.4 mg under the tongue every 5 (five) minutes as needed for chest pain.     pantoprazole  (PROTONIX ) 20 MG tablet Take 20 mg by mouth daily.     psyllium (HYDROCIL/METAMUCIL) 95 % PACK Take 1 packet by mouth daily.       Discharge Medications: Please see discharge summary for a list of discharge medications.  Relevant Imaging Results:  Relevant Lab Results:   Additional Information [id number]  [name], RN

## 2024-03-11 NOTE — ED Provider Notes (Signed)
-----------------------------------------   5:24 AM on 03/11/2024 -----------------------------------------   Blood pressure (!) 136/59, pulse 82, temperature (!) 97.5 F (36.4 C), temperature source Oral, resp. rate 18, height 4' 10 (1.473 m), weight 40.4 kg, SpO2 96%.  The patient is calm and cooperative at this time.  There have been no acute events since the last update.  Awaiting disposition plan from case management/social work.    Lachell Rochette, Josette SAILOR, DO 03/11/24 [phone]

## 2024-03-11 NOTE — ED Notes (Signed)
 Pt sitting up in recliner, chair alarm in place. Denies needs.

## 2024-03-11 NOTE — TOC Progression Note (Signed)
 Transition of Care [facility]) - Progression Note    Patient Details  Name: Sharon Barajas MRN: [id number] Date of Birth: 18-Nov-1931  [facility]) CM/SW Contact  [name], RN Phone Number: 03/11/2024, 2:15 PM  Clinical Narrative:    CM spoke with granddaughter this morning, currently 2 bed offers one for [place] and the other for [facility].  She would prefer if there could be something in [place] as that would be closer to her.  CM will check with [facility] and [facility].  Brianna from [facility] said that she would review and also come to see patient.  Dena did come by and reports that she will likely offer. Once she got back to the building she did offer a bed and said that it was okay to start insurance auth.  Talked with granddaughter and she is okay with accepting bed at [facility].  Insurance auth to be started.     Expected Discharge Plan: Skilled Nursing Facility Barriers to Discharge: English As A Second Language Teacher               Expected Discharge Plan and Services   Discharge Planning Services: CM Consult Post Acute Care Choice: Skilled Nursing Facility Living arrangements for the past 2 months: Single Family Home                 DME Arranged: N/A         HH Arranged: NA           Social Drivers of Health (SDOH) Interventions SDOH Screenings   Food Insecurity: No Food Insecurity (09/27/2023)   Received from [facility]  Housing: Low Risk  (09/27/2023)   Received from [facility]  Transportation Needs: No Transportation Needs (09/27/2023)   Received from [facility]  Utilities: Not At Risk (09/27/2023)   Received from [facility]  Financial Resource Strain: Low Risk  (09/27/2023)   Received from [facility]  Tobacco Use: Low Risk  (03/09/2024)    Readmission Risk Interventions     No data to display

## 2024-03-11 NOTE — Progress Notes (Signed)
 Physical Therapy Treatment Patient Details Name: Sharon Barajas MRN: [id number] DOB: 12/29/1931 Today's Date: 03/11/2024   History of Present Illness Katiria  C Melberg is a 89 y.o. female with a history of hypertension, dementia who is brought to the ED due to a fall at home.  She has been living with her sister who is 72 years old, but has had functional decline over the past month.  Patient frequently is confused, often forgets to use an assistive device or wait for help from her sister and has become increasingly at risk for fall and injury.  Today she fell, and she reports pain in the left knee.  No head injury    PT Comments  Pt in bed,  stated she needs to void.  She is able to get to EOB with bed features and min a x 1.  Steady in sitting.  Stands to RW and is able to transfer to [facility] with min a x 1.  Needs min a for clothing management and max a for pericare in standing.  She is able to increase gait distances today with overall improved gait quality.  She walks 200' with RW until voicing fatigue but does opt to remain up in chair with needs in reach.  She continues to need min a x 1 for all mobility for general safety and balance.  She is unsafe to walk unassisted.  < 3 hrs therapy at discharge remains appropriate.   If plan is discharge home, recommend the following: Help with stairs or ramp for entrance;Direct supervision/assist for medications management;Assistance with feeding;Assist for transportation;Direct supervision/assist for financial management;Supervision due to cognitive status;Assistance with cooking/housework;A little help with walking and/or transfers;A little help with bathing/dressing/bathroom   Can travel by private vehicle        Equipment Recommendations       Recommendations for Other Services       Precautions / Restrictions Precautions Precautions: Fall Restrictions Weight Bearing Restrictions Per Provider Order: No     Mobility  Bed Mobility Overal  bed mobility: Needs Assistance Bed Mobility: Supine to Sit     Supine to sit: Contact guard, Min assist       Patient Response: Cooperative  Transfers Overall transfer level: Needs assistance   Transfers: Sit to/from Stand Sit to Stand: Contact guard assist                Ambulation/Gait Ambulation/Gait assistance: Min assist Gait Distance (Feet): 200 Feet Assistive device: Rolling walker (2 wheels)   Gait velocity: decreased     General Gait Details: significantly improved gait noted today but continues to need +1 assist at all times for mobility for general safety   Stairs             Wheelchair Mobility     Tilt Bed Tilt Bed Patient Response: Cooperative  Modified Rankin (Stroke Patients Only)       Balance Overall balance assessment: Needs assistance Sitting-balance support: Feet supported Sitting balance-Leahy Scale: Fair     Standing balance support: During functional activity, Reliant on assistive device for balance Standing balance-Leahy Scale: Fair Standing balance comment: able to assist with clothing for toiletting but needs +1 assist.                            Communication Communication Communication: Impaired Factors Affecting Communication: Hearing impaired  Cognition Arousal: Alert Behavior During Therapy: WFL for tasks assessed/performed   PT - Cognitive impairments:  History of cognitive impairments, Difficult to assess                       PT - Cognition Comments: difficult to assess due to hearing, but does seem improved today Following commands: Impaired Following commands impaired: Follows one step commands inconsistently    Cueing Cueing Techniques: Verbal cues, Gestural cues, Tactile cues, Visual cues  Exercises      General Comments        Pertinent Vitals/Pain Pain Assessment Pain Assessment: No/denies pain    Home Living                          Prior Function             PT Goals (current goals can now be found in the care plan section) Progress towards PT goals: Progressing toward goals    Frequency    Min 2X/week      PT Plan      Co-evaluation              AM-PAC PT 6 Clicks Mobility   Outcome Measure  Help needed turning from your back to your side while in a flat bed without using bedrails?: A Little Help needed moving from lying on your back to sitting on the side of a flat bed without using bedrails?: A Little Help needed moving to and from a bed to a chair (including a wheelchair)?: A Little Help needed standing up from a chair using your arms (e.g., wheelchair or bedside chair)?: A Little Help needed to walk in hospital room?: A Little Help needed climbing 3-5 steps with a railing? : A Lot 6 Click Score: 17    End of Session Equipment Utilized During Treatment: Gait belt Activity Tolerance: Patient tolerated treatment well Patient left: in chair;with call bell/phone within reach;with chair alarm set Nurse Communication: Mobility status PT Visit Diagnosis: Other abnormalities of gait and mobility (R26.89);Muscle weakness (generalized) (M62.81);Difficulty in walking, not elsewhere classified (R26.2);Unsteadiness on feet (R26.81)     Time: 8589-8573 PT Time Calculation (min) (ACUTE ONLY): 16 min  Charges:    $Gait Training: 8-22 mins PT General Charges $$ ACUTE PT VISIT: 1 Visit                   Lauraine Gills, PTA 03/11/24, 3:01 PM

## 2024-03-11 NOTE — ED Notes (Signed)
 Pt's brief changed and pt pulled up in the bed. Call bell placed in pt's hand.

## 2024-03-11 NOTE — TOC Progression Note (Signed)
 Transition of Care [facility]) - Progression Note    Patient Details  Name: Sharon Barajas MRN: [id number] Date of Birth: [birth year]  [facility]) CM/SW Contact  [name], RN Phone Number: 03/11/2024, 3:37 PM  Clinical Narrative:    Insurance authorization is pending.     Expected Discharge Plan: Skilled Nursing Facility Barriers to Discharge: English As A Second Language Teacher               Expected Discharge Plan and Services   Discharge Planning Services: CM Consult Post Acute Care Choice: Skilled Nursing Facility Living arrangements for the past 2 months: Single Family Home                 DME Arranged: N/A         HH Arranged: NA           Social Drivers of Health (SDOH) Interventions SDOH Screenings   Food Insecurity: No Food Insecurity (09/27/2023)   Received from [facility]  Housing: Low Risk  (09/27/2023)   Received from [facility]  Transportation Needs: No Transportation Needs (09/27/2023)   Received from [facility]  Utilities: Not At Risk (09/27/2023)   Received from [facility]  Financial Resource Strain: Low Risk  (09/27/2023)   Received from [facility]  Tobacco Use: Low Risk  (03/09/2024)    Readmission Risk Interventions     No data to display

## 2024-03-12 NOTE — TOC Progression Note (Signed)
 Transition of Care [facility]) - Progression Note    Patient Details  Name: Sharon Barajas MRN: [id number] Date of Birth: 1931/05/29  [facility]) CM/SW Contact  [name], RN Phone Number: 03/12/2024, 9:34 AM  Clinical Narrative:     Insurance authorization is still pending.   Expected Discharge Plan: Skilled Nursing Facility Barriers to Discharge: English As A Second Language Teacher               Expected Discharge Plan and Services   Discharge Planning Services: CM Consult Post Acute Care Choice: Skilled Nursing Facility Living arrangements for the past 2 months: Single Family Home                 DME Arranged: N/A         HH Arranged: NA           Social Drivers of Health (SDOH) Interventions SDOH Screenings   Food Insecurity: No Food Insecurity (09/27/2023)   Received from [facility]  Housing: Low Risk  (09/27/2023)   Received from [facility]  Transportation Needs: No Transportation Needs (09/27/2023)   Received from [facility]  Utilities: Not At Risk (09/27/2023)   Received from [facility]  Financial Resource Strain: Low Risk  (09/27/2023)   Received from [facility]  Tobacco Use: Low Risk  (03/09/2024)    Readmission Risk Interventions     No data to display

## 2024-03-12 NOTE — ED Provider Notes (Signed)
 Emergency Medicine Observation Re-evaluation Note  Sharon  C Barajas is a 89 y.o. female, seen on rounds today.  Pt initially presented to the ED for complaints of Fall Currently, the patient is resting comfortably in the room, speaking with her granddaughter.  No acute events overnight.  Physical Exam  BP 129/69   Pulse 88   Temp 97.9 F (36.6 C) (Oral)   Resp 16   Ht 4' 10 (1.473 m)   Wt 40.4 kg   SpO2 95%   BMI 18.60 kg/m  Physical Exam General: No acute distress CV: Good perfusion Lungs: Normal respiratory effort Psych: Normal affect, does not appear to be responding to internal stimuli, calm and cooperative  ED Course / MDM   I have reviewed the labs performed to date as well as medications administered while in observation.  No recent changes in the last 24 hours..  Plan  Current plan is for pending insurance authorization for SNF placement.    Rexford Reche HERO, MD 03/12/24 1011

## 2024-03-12 NOTE — ED Notes (Signed)
 Pt ABCs intact. RR even and unlabored. Pt in NAD. Bed in lowest locked position. Call bell in reach. Denies needs at this time.    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   Hypercholesteremia    Hypertension    Macular degeneration

## 2024-03-12 NOTE — ED Notes (Signed)
 Pt's brief changed. Bed alarm on. No other needs at this time.

## 2024-03-12 NOTE — TOC Progression Note (Signed)
 Transition of Care [facility]) - Progression Note    Patient Details  Name: Sharon  ALAYZIA Barajas MRN: [id number] Date of Birth: 01-27-1932  Transition of Care [facility]) CM/SW Contact  [name], RN Phone Number: 03/12/2024, 2:50 PM  Clinical Narrative:    Granddaughter updated.  Insurance authorization is still pending.  Mandi reports that even if insurance authorization is denied she still wants her grandmother to go to [employer] and they will private pay as they need to spend down so that she will eventually qualify for Medicaid for LTC.     Expected Discharge Plan: Skilled Nursing Facility Barriers to Discharge: English As A Second Language Teacher               Expected Discharge Plan and Services   Discharge Planning Services: CM Consult Post Acute Care Choice: Skilled Nursing Facility Living arrangements for the past 2 months: Single Family Home                 DME Arranged: N/A         HH Arranged: NA           Social Drivers of Health (SDOH) Interventions SDOH Screenings   Food Insecurity: No Food Insecurity (09/27/2023)   Received from [facility]  Housing: Low Risk  (09/27/2023)   Received from [facility]  Transportation Needs: No Transportation Needs (09/27/2023)   Received from [facility]  Utilities: Not At Risk (09/27/2023)   Received from [facility]  Financial Resource Strain: Low Risk  (09/27/2023)   Received from [facility]  Tobacco Use: Low Risk  (03/09/2024)    Readmission Risk Interventions     No data to display

## 2024-03-12 NOTE — TOC Progression Note (Signed)
 Transition of Care [facility]) - Progression Note    Patient Details  Name: Sharon Barajas MRN: [id number] Date of Birth: [birth year]  Transition of Care [facility]) CM/SW Contact  [name], RN Phone Number: 03/12/2024, 3:04 PM  Clinical Narrative:    Insurance authorization approved Plan auth ID [id number], shara ID [id number] approved 12/8-12/11 next review 12/11.  Dena at [employer] says that patient can come tomorrow.    Expected Discharge Plan: Skilled Nursing Facility Barriers to Discharge: English As A Second Language Teacher               Expected Discharge Plan and Services   Discharge Planning Services: CM Consult Post Acute Care Choice: Skilled Nursing Facility Living arrangements for the past 2 months: Single Family Home                 DME Arranged: N/A         HH Arranged: NA           Social Drivers of Health (SDOH) Interventions SDOH Screenings   Food Insecurity: No Food Insecurity (09/27/2023)   Received from [facility]  Housing: Low Risk  (09/27/2023)   Received from [facility]  Transportation Needs: No Transportation Needs (09/27/2023)   Received from [facility]  Utilities: Not At Risk (09/27/2023)   Received from [facility]  Financial Resource Strain: Low Risk  (09/27/2023)   Received from [facility]  Tobacco Use: Low Risk  (03/09/2024)    Readmission Risk Interventions     No data to display

## 2024-03-13 NOTE — ED Provider Notes (Signed)
----------------------------------------- °  6:50 AM on 03/13/2024 -----------------------------------------   Blood pressure (!) 144/61, pulse 79, temperature 98.1 F (36.7 C), temperature source Oral, resp. rate 16, height 1.473 m (4' 10), weight 40.4 kg, SpO2 97%.  The patient is calm and cooperative at this time.  There have been no acute events since the last update.  Awaiting disposition plan from [facility] team.   Gordan Huxley, MD 03/13/24 [phone]

## 2024-03-13 NOTE — ED Notes (Signed)
 Pt to [facility] POV with granddaughter.

## 2024-03-13 NOTE — ED Notes (Signed)
 Report given to [employer]

## 2024-03-13 NOTE — ED Notes (Signed)
 This tech and Mayra EDT changed pt after bowel incontinence. Pt was assisted to toilet where she continued to have very soft/liquid BM. Pt was wiped with warm bath wipes and new brief was applied. Pt was assisted back to recliner. Fall bundle in place. Call bell in reach.

## 2024-03-13 NOTE — TOC Transition Note (Signed)
 Transition of Care [facility]) - Discharge Note   Patient Details  Name: Sharon Barajas MRN: [id number] Date of Birth: 1932-03-26  Transition of Care [facility]) CM/SW Contact:  [name], RN Phone Number: 03/13/2024, 9:00 AM   Clinical Narrative:     [facility] will [facility] to [facility], granddaughter will transport her.  Patient going to room E7.  ED RN to call report to [phone].  Final next level of care: Skilled Nursing Facility Barriers to Discharge: Barriers Resolved   Patient Goals and CMS Choice Patient states their goals for this hospitalization and ongoing recovery are:: Granddaughter would like reahab in [place] if possible to be close to her CMS Medicare.gov Compare Post Acute Care list provided to:: Patient Represenative (must comment) Choice offered to / list presented to : Adult Children      Discharge Placement PASRR number recieved: 03/10/24            Patient chooses bed at: [facility] Patient to be transferred to facility by: Granddaughter Name of family member notified: [facility] Patient and family notified of of transfer: 03/13/24  Discharge Plan and Services Additional resources added to the After Visit Summary for     Discharge Planning Services: CM Consult Post Acute Care Choice: Skilled Nursing Facility          DME Arranged: N/A         HH Arranged: NA          Social Drivers of Health (SDOH) Interventions SDOH Screenings   Food Insecurity: No Food Insecurity (09/27/2023)   Received from [facility]  Housing: Low Risk  (09/27/2023)   Received from [facility]  Transportation Needs: No Transportation Needs (09/27/2023)   Received from [facility]  Utilities: Not At Risk (09/27/2023)   Received from [facility]  Financial Resource Strain: Low Risk  (09/27/2023)   Received from [facility]  Tobacco Use: Low Risk  (03/09/2024)      Readmission Risk Interventions     No data to display

## 2024-03-13 NOTE — ED Provider Notes (Signed)
 Patient was evaluated by social work, cleared for discharge to SNF.  Will discharge to SNF.   Waymond Lorelle Cummins, MD 03/13/24 0900
